# Patient Record
Sex: Female | Born: 1980
Health system: Southern US, Community
[De-identification: ages and names within clinical notes are randomized; demographics above are authoritative.]

## PROBLEM LIST (undated history)

## (undated) DIAGNOSIS — G43909 Migraine, unspecified, not intractable, without status migrainosus: Secondary | ICD-10-CM

## (undated) DIAGNOSIS — Z8489 Family history of other specified conditions: Secondary | ICD-10-CM

## (undated) DIAGNOSIS — K219 Gastro-esophageal reflux disease without esophagitis: Secondary | ICD-10-CM

## (undated) DIAGNOSIS — M419 Scoliosis, unspecified: Secondary | ICD-10-CM

## (undated) DIAGNOSIS — T148XXA Other injury of unspecified body region, initial encounter: Secondary | ICD-10-CM

## (undated) DIAGNOSIS — T8859XA Other complications of anesthesia, initial encounter: Secondary | ICD-10-CM

## (undated) DIAGNOSIS — J45909 Unspecified asthma, uncomplicated: Secondary | ICD-10-CM

## (undated) DIAGNOSIS — M1990 Unspecified osteoarthritis, unspecified site: Secondary | ICD-10-CM

## (undated) DIAGNOSIS — F419 Anxiety disorder, unspecified: Secondary | ICD-10-CM

## (undated) DIAGNOSIS — D649 Anemia, unspecified: Secondary | ICD-10-CM

## (undated) DIAGNOSIS — N809 Endometriosis, unspecified: Secondary | ICD-10-CM

## (undated) DIAGNOSIS — F32A Depression, unspecified: Secondary | ICD-10-CM

## (undated) DIAGNOSIS — F988 Other specified behavioral and emotional disorders with onset usually occurring in childhood and adolescence: Secondary | ICD-10-CM

## (undated) DIAGNOSIS — T7840XA Allergy, unspecified, initial encounter: Secondary | ICD-10-CM

## (undated) DIAGNOSIS — F329 Major depressive disorder, single episode, unspecified: Secondary | ICD-10-CM

## (undated) DIAGNOSIS — J189 Pneumonia, unspecified organism: Secondary | ICD-10-CM

## (undated) DIAGNOSIS — T4145XA Adverse effect of unspecified anesthetic, initial encounter: Secondary | ICD-10-CM

## (undated) HISTORY — DX: Allergy, unspecified, initial encounter: T78.40XA

## (undated) HISTORY — DX: Migraine, unspecified, not intractable, without status migrainosus: G43.909

## (undated) HISTORY — PX: COSMETIC SURGERY: SHX468

## (undated) HISTORY — DX: Anxiety disorder, unspecified: F41.9

## (undated) HISTORY — PX: ABDOMINAL HYSTERECTOMY: SHX81

## (undated) HISTORY — DX: Endometriosis, unspecified: N80.9

## (undated) HISTORY — DX: Other specified behavioral and emotional disorders with onset usually occurring in childhood and adolescence: F98.8

## (undated) HISTORY — DX: Scoliosis, unspecified: M41.9

---
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
## 1998-02-04 ENCOUNTER — Inpatient Hospital Stay (HOSPITAL_COMMUNITY): Admission: AD | Admit: 1998-02-04 | Discharge: 1998-02-04 | Payer: Self-pay | Admitting: Obstetrics and Gynecology

## 1998-03-12 ENCOUNTER — Inpatient Hospital Stay (HOSPITAL_COMMUNITY): Admission: AD | Admit: 1998-03-12 | Discharge: 1998-03-12 | Payer: Self-pay | Admitting: Obstetrics and Gynecology

## 1998-03-16 ENCOUNTER — Inpatient Hospital Stay (HOSPITAL_COMMUNITY): Admission: AD | Admit: 1998-03-16 | Discharge: 1998-03-16 | Payer: Self-pay | Admitting: Obstetrics and Gynecology

## 1998-04-08 ENCOUNTER — Inpatient Hospital Stay (HOSPITAL_COMMUNITY): Admission: AD | Admit: 1998-04-08 | Discharge: 1998-04-08 | Payer: Self-pay | Admitting: Obstetrics and Gynecology

## 1998-05-05 ENCOUNTER — Inpatient Hospital Stay (HOSPITAL_COMMUNITY): Admission: AD | Admit: 1998-05-05 | Discharge: 1998-05-08 | Payer: Self-pay | Admitting: *Deleted

## 2001-01-28 ENCOUNTER — Other Ambulatory Visit: Admission: RE | Admit: 2001-01-28 | Discharge: 2001-01-28 | Payer: Self-pay | Admitting: *Deleted

## 2001-08-07 ENCOUNTER — Inpatient Hospital Stay (HOSPITAL_COMMUNITY): Admission: AD | Admit: 2001-08-07 | Discharge: 2001-08-10 | Payer: Self-pay | Admitting: *Deleted

## 2003-01-04 ENCOUNTER — Other Ambulatory Visit: Admission: RE | Admit: 2003-01-04 | Discharge: 2003-01-04 | Payer: Self-pay | Admitting: *Deleted

## 2006-06-27 ENCOUNTER — Ambulatory Visit: Payer: Self-pay | Admitting: Family Medicine

## 2007-03-22 ENCOUNTER — Ambulatory Visit: Payer: Self-pay | Admitting: Family Medicine

## 2007-05-21 ENCOUNTER — Emergency Department (HOSPITAL_COMMUNITY): Admission: EM | Admit: 2007-05-21 | Discharge: 2007-05-22 | Payer: Self-pay | Admitting: Emergency Medicine

## 2007-07-11 ENCOUNTER — Ambulatory Visit (HOSPITAL_COMMUNITY): Admission: RE | Admit: 2007-07-11 | Discharge: 2007-07-11 | Payer: Self-pay | Admitting: Obstetrics and Gynecology

## 2007-07-12 ENCOUNTER — Inpatient Hospital Stay (HOSPITAL_COMMUNITY): Admission: AD | Admit: 2007-07-12 | Discharge: 2007-07-12 | Payer: Self-pay | Admitting: Obstetrics and Gynecology

## 2007-09-19 ENCOUNTER — Encounter (HOSPITAL_COMMUNITY): Payer: Self-pay | Admitting: Obstetrics and Gynecology

## 2007-09-19 ENCOUNTER — Inpatient Hospital Stay (HOSPITAL_COMMUNITY): Admission: RE | Admit: 2007-09-19 | Discharge: 2007-09-21 | Payer: Self-pay | Admitting: Obstetrics and Gynecology

## 2009-08-31 ENCOUNTER — Ambulatory Visit: Payer: Self-pay | Admitting: Family Medicine

## 2009-08-31 DIAGNOSIS — M545 Low back pain, unspecified: Secondary | ICD-10-CM | POA: Insufficient documentation

## 2009-09-27 DIAGNOSIS — Z8639 Personal history of other endocrine, nutritional and metabolic disease: Secondary | ICD-10-CM

## 2009-09-27 DIAGNOSIS — Z862 Personal history of diseases of the blood and blood-forming organs and certain disorders involving the immune mechanism: Secondary | ICD-10-CM | POA: Insufficient documentation

## 2009-09-27 DIAGNOSIS — G43009 Migraine without aura, not intractable, without status migrainosus: Secondary | ICD-10-CM | POA: Insufficient documentation

## 2010-05-09 ENCOUNTER — Encounter (INDEPENDENT_AMBULATORY_CARE_PROVIDER_SITE_OTHER): Payer: Self-pay | Admitting: *Deleted

## 2010-05-19 ENCOUNTER — Ambulatory Visit: Payer: Self-pay | Admitting: Family Medicine

## 2010-07-08 ENCOUNTER — Ambulatory Visit: Payer: Self-pay | Admitting: Family Medicine

## 2010-07-08 DIAGNOSIS — L03211 Cellulitis of face: Secondary | ICD-10-CM

## 2010-07-08 DIAGNOSIS — L0201 Cutaneous abscess of face: Secondary | ICD-10-CM | POA: Insufficient documentation

## 2010-10-01 HISTORY — PX: BREAST SURGERY: SHX581

## 2010-10-05 ENCOUNTER — Ambulatory Visit
Admission: RE | Admit: 2010-10-05 | Discharge: 2010-10-05 | Payer: Self-pay | Source: Home / Self Care | Attending: Family Medicine | Admitting: Family Medicine

## 2010-10-05 DIAGNOSIS — F4320 Adjustment disorder, unspecified: Secondary | ICD-10-CM | POA: Insufficient documentation

## 2010-10-31 NOTE — Letter (Signed)
Summary: Natasha Welch letter  [place] at [facility]  [address] [place], [place] [address]   Phone: [phone]  Fax: [phone]       05/09/2010 MRN: [id number]  [facility] [place] [address] [place], [place]  [address]  Dear Ms. Apodaca,  [facility] [place], and [place] announce the retirement of Arta Silence, M.D., from full-time practice at the [facility] office effective March 30, 2010 and his plans of returning part-time.  It is important to Dr. Hetty Ely and to our practice that you understand that [facility] has seven physicians in our office for your health care needs.  We will continue to offer the same exceptional care that you have today.    Dr. Hetty Ely has spoken to many of you about his plans for retirement and returning part-time in the fall.   We will continue to work with you through the transition to schedule appointments for you in the office and meet the high standards that [facility] is committed to.   Again, it is with great pleasure that we share the news that Dr. Hetty Ely will return to [facility] in October of 2011 with a reduced schedule.    If you have any questions, or would like to request an appointment with one of our physicians, please call us at [phone] and press the option for Scheduling an appointment.  We take pleasure in providing you with excellent patient care and look forward to seeing you at your next office visit.  Our [facility] Physicians are:  Tillman Abide, M.D. Laurita Quint, M.D. Roxy Manns, M.D. Kerby Nora, M.D. Hannah Beat, M.D. Ruthe Mannan, M.D. We proudly welcomed Raechel Ache, M.D. and Eustaquio Boyden, M.D. to the practice in July/August 2011.  Sincerely,  [facility]
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
## 2010-10-31 NOTE — Assessment & Plan Note (Signed)
Summary: back pain/alc   Vital Signs:  Patient profile:   30 year old female Height:      66 inches Weight:      194.75 pounds BMI:     31.55 Temp:     98.9 degrees F oral Pulse rate:   88 / minute Pulse rhythm:   regular BP sitting:   98 / 58  (left arm) Cuff size:   large  Vitals Entered By: Delilah Shan CMA Duncan Dull) (May 19, 2010 2:38 PM) CC: Back pain   History of Present Illness: Pain on R side today after lifting at work yesterday.  Works at a daycare.  No pain yesterady.  No FCNAV.  No dysuria.  R lower back and down R leg. Better with standing or "getting in a good position."  Pain w/twisthing to the right.  Able to walk but painful.  "It's like something grabs in my lower back back and sends a funny feeling downt the back of my leg."    Allergies: 1)  ! * Tetanus 2)  ! * Anesthesia  Review of Systems       See HPI.  Otherwise negative.    Physical Exam  General:  GEN: nad, alert and oriented, uncomfortable with certain movement.  HEENT: mucous membranes moist NECK: supple w/o LA CV: rrr.  no murmur PULM: ctab, no inc wob ABD: soft, +bs EXT: no edema SKIN: no acute rash  Back w/o midline pain.  pain with facet loading and forward flexion <45deg. R lumbar paraspinal area tender to palpation. SLR neg for radicular pain but pain increases in R lower back with testing.  L SLR neg.  Distally NV intact.    Impression & Recommendations:  Problem # 1:  LOW BACK PAIN, ACUTE (ICD-724.2) R sciatica/lumbar strain.  Demonstrated stretching for patient.  If not better, will need PT. She was also asking about weight loss meds.  I d/w patient re: these. To my knowledge, there is no long-term evidence that demonstrates safe, effective weight loss with any supplement.  I would not rx.  She needs to work on diet and exercise with goal 1-2 lbs per month of loss.  She can go back to work when not needing pain meds.  follow up as needed.  GI and sedation caution given BM:WUXL.  The  following medications were removed from the medication list:    Vicodin 5-500 Mg Tabs (Hydrocodone-acetaminophen) .Marland Kitchen... Take 1 every 6 hrs as needed pain    Soma 250 Mg Tabs (Carisoprodol) .Marland Kitchen... Take up to 4 times a day for muscle spasm Her updated medication list for this problem includes:    Ibuprofen 800 Mg Tabs (Ibuprofen) .Marland Kitchen... 1 by mouth three times a day as needed for pain with food    Vicodin 5-500 Mg Tabs (Hydrocodone-acetaminophen) .Marland Kitchen... 1-2 by mouth three times a day as needed for pain with sedation caution  Complete Medication List: 1)  Ibuprofen 800 Mg Tabs (Ibuprofen) .Marland Kitchen.. 1 by mouth three times a day as needed for pain with food 2)  Vicodin 5-500 Mg Tabs (Hydrocodone-acetaminophen) .Marland Kitchen.. 1-2 by mouth three times a day as needed for pain with sedation caution  Patient Instructions: 1)  Return to work when not needing pain meds.  Call back if not improving with stretching so we can set up physical therapy.  2)  Please schedule a follow-up appointment as needed .  Prescriptions: VICODIN 5-500 MG TABS (HYDROCODONE-ACETAMINOPHEN) 1-2 by mouth three times a day as needed for  pain with sedation caution  #30 x 0   Entered and Authorized by:   Crawford Givens MD   Signed by:   Crawford Givens MD on 05/19/2010   Method used:   Print then Give to Patient   RxID:   [id number] IBUPROFEN 800 MG TABS (IBUPROFEN) 1 by mouth three times a day as needed for pain with food  #30 x 0   Entered and Authorized by:   Crawford Givens MD   Signed by:   Crawford Givens MD on 05/19/2010   Method used:   Print then Give to Patient   RxID:   [id number]   Current Allergies (reviewed today): ! * TETANUS ! * ANESTHESIA
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
## 2010-11-24 ENCOUNTER — Ambulatory Visit (INDEPENDENT_AMBULATORY_CARE_PROVIDER_SITE_OTHER): Payer: BC Managed Care – PPO | Admitting: Family Medicine

## 2010-11-24 ENCOUNTER — Encounter: Payer: Self-pay | Admitting: Family Medicine

## 2010-11-24 DIAGNOSIS — M545 Low back pain, unspecified: Secondary | ICD-10-CM

## 2010-11-28 NOTE — Assessment & Plan Note (Signed)
Summary: BACK PAIN/CLE   [employer]   Vital Signs:  Patient profile:   30 year old female Height:      66 inches Weight:      180.75 pounds BMI:     29.28 Temp:     98.4 degrees F oral Pulse rate:   80 / minute Pulse rhythm:   regular BP sitting:   102 / 72  (left arm) Cuff size:   regular  Vitals Entered By: Delilah Shan CMA Cloys Vera Dull) (November 24, 2010 11:19 AM) CC: Back pain   History of Present Illness: Home situation is now improved.  Moved this past weekend and the back pain flared after that. She ran out of vicodin on Tuesday.  Pain in L lower back and down L leg.  Better if laying on stomach or sitting forward- worse leaning back.  She had some periods with lack of pain but it flared up with the move.  Living with kids.  Was lifting with the move. No pop/snap/sudden increase in pain.   Working to lose weight, working on diet.    Allergies: 1)  ! * Tetanus 2)  ! * Anesthesia  Past History:  Past Medical History: Last updated: 10/05/2010 adjustment/anxiety related to marriage/divorce 2011  Social History: Reviewed history from 10/05/2010 and no changes required. Marital Status: divorce pending as of 2012 Children: 2- daughter and son occ alcohol  Review of Systems       See HPI.  Otherwise negative.    Physical Exam  General:  no apparent distress but uncomfortable, sitting forward in the chair normocephalic atraumatic mucous membranes moist neck with normal range of motion regular rate and rhythm clear to auscultation bilaterally back w/o midline pain but bilateral lower paraspinal muscle tenderness.  L SLR pos, distally nv intact.  pain with back extension,  and with flexion at the waist >45deg   Impression & Recommendations:  Problem # 1:  LOW BACK PAIN, ACUTE (ICD-724.2) She has no weakness.  With the pos SLR and radicular symptoms, I would start the prednisone, hold other nsaids, and GI caution given.  Use the vicodin as needed and continue stretching.   No indication for imaging.  follow up/call back as needed. she agrees.  Her updated medication list for this problem includes:    Vicodin 5-500 Mg Tabs (Hydrocodone-acetaminophen) .Marland Kitchen... 1-2 by mouth three times a day as needed for pain with sedation caution  Complete Medication List: 1)  Vicodin 5-500 Mg Tabs (Hydrocodone-acetaminophen) .Marland Kitchen.. 1-2 by mouth three times a day as needed for pain with sedation caution 2)  Bactroban 2 % Oint (Mupirocin) .... Apply to affected area two times a day 3)  Prozac 20 Mg Caps (Fluoxetine hcl) .Marland Kitchen.. 1 by mouth once daily 4)  Xanax 0.5 Mg Tabs (Alprazolam) .... 1/2 to 1 tab by mouth three times a day as needed for anxiety, sedation caution 5)  Prednisone 20 Mg Tabs (Prednisone) .... 2 by mouth once daily for 3 days, 1 by mouth once daily for 3 days, 1/2 by mouth once daily for 4 days.  take with food.  Patient Instructions: 1)  Take the prednisone with food.  Use the vicodin as needed.  It can make you drowsy.  Keep exercising and stretching your back.  Let me know if it isn't getting better.  Prescriptions: VICODIN 5-500 MG TABS (HYDROCODONE-ACETAMINOPHEN) 1-2 by mouth three times a day as needed for pain with sedation caution  #30 x 0   Entered and Authorized by:  Crawford Givens MD   Signed by:   Crawford Givens MD on 11/24/2010   Method used:   Print then Give to Patient   RxID:   [id number] PREDNISONE 20 MG TABS (PREDNISONE) 2 by mouth once daily for 3 days, 1 by mouth once daily for 3 days, 1/2 by mouth once daily for 4 days.  take with food.  #11 x 0   Entered and Authorized by:   Crawford Givens MD   Signed by:   Crawford Givens MD on 11/24/2010   Method used:   Print then Give to Patient   RxID:   [id number]    Orders Added: 1)  Est. Patient Level III [64403]    Current Allergies (reviewed today): ! * TETANUS ! * ANESTHESIA

## 2010-12-01 ENCOUNTER — Telehealth: Payer: Self-pay | Admitting: Family Medicine

## 2010-12-04 ENCOUNTER — Telehealth: Payer: Self-pay | Admitting: Family Medicine

## 2010-12-06 ENCOUNTER — Telehealth: Payer: Self-pay | Admitting: Family Medicine

## 2010-12-07 ENCOUNTER — Encounter: Payer: Self-pay | Admitting: Family Medicine

## 2010-12-07 ENCOUNTER — Ambulatory Visit (INDEPENDENT_AMBULATORY_CARE_PROVIDER_SITE_OTHER): Payer: BC Managed Care – PPO | Admitting: Family Medicine

## 2010-12-07 DIAGNOSIS — M545 Low back pain, unspecified: Secondary | ICD-10-CM

## 2010-12-07 NOTE — Progress Notes (Signed)
Summary: back pain is not better  Phone Note Call from Patient Call back at Home Phone [phone]   Caller: Patient Call For: Dr. Para March Summary of Call: Pt was seen last friday for back pain.  She states this is not getting any better.  She will finish her  prednisone this weekend and she is almost out of vicodin.  She has been doing the stretches and exercises, using heating pad.  She is asking what else to do.  [facility]. Initial call taken by: Lowella Petties CMA, AAMA,  December 01, 2010 2:25 PM  Follow-up for Phone Call        I would get her set up with physical therapy.  I'll put in the order.  Have her finish the prednisone and please call in more vicodin for the meantime.  thanks.  Follow-up by: Crawford Givens MD,  December 01, 2010 2:45 PM  Additional Follow-up for Phone Call Additional follow up Details #1::        Patient Advised. Medication phoned to pharmacy.  Additional Follow-up by: Delilah Shan CMA Dawnya Grams Dull),  December 01, 2010 4:34 PM    Prescriptions: VICODIN 5-500 MG TABS (HYDROCODONE-ACETAMINOPHEN) 1-2 by mouth three times a day as needed for pain with sedation caution  #30 x 0   Entered and Authorized by:   Crawford Givens MD   Signed by:   Crawford Givens MD on 12/01/2010   Method used:   Telephoned to ...       [facility]* (retail)       [address]       [place], [place]  [address]       Ph: [phone]       Fax: [phone]   RxID:   [id number]

## 2010-12-12 NOTE — Progress Notes (Signed)
Summary: xanax   Phone Note Refill Request Message from:  Fax from Pharmacy on December 04, 2010 10:37 AM  Refills Requested: Medication #1:  XANAX 0.5 MG TABS 1/2 to 1 tab by mouth three times a day as needed for anxiety   Last Refilled: 10/05/2010 Refill request from [facility]. [phone]  Initial call taken by: Melody Comas,  December 04, 2010 10:38 AM  Follow-up for Phone Call        please call in.  thanks.  Follow-up by: Crawford Givens MD,  December 04, 2010 2:03 PM  Additional Follow-up for Phone Call Additional follow up Details #1::        Medication phoned to pharmacy.  Additional Follow-up by: Delilah Shan CMA (AAMA),  December 04, 2010 2:08 PM    Prescriptions: XANAX 0.5 MG TABS (ALPRAZOLAM) 1/2 to 1 tab by mouth three times a day as needed for anxiety, sedation caution  #40 x 1   Entered and Authorized by:   Crawford Givens MD   Signed by:   Crawford Givens MD on 12/04/2010   Method used:   Telephoned to ...       [facility]* (retail)       [address]       [place], [place]  [address]       Ph: [phone]       Fax: [phone]   RxID:   [id number]

## 2010-12-12 NOTE — Assessment & Plan Note (Signed)
Summary: Back pain /lsf   Vital Signs:  Patient profile:   30 year old female Height:      66 inches Weight:      186.75 pounds BMI:     30.25 Temp:     97.7 degrees F oral Pulse rate:   84 / minute Pulse rhythm:   regular BP sitting:   102 / 70  (left arm) Cuff size:   regular  Vitals Entered By: Delilah Shan CMA Dorris Vangorder Dull) (December 07, 2010 10:57 AM) CC: Back pain   History of Present Illness: No help with prednisone.  Pain on L side continues.  Pain radiates down into L leg.  Minimal relief with the vicodin.  Trouble sleeping from the pain, this is getting worse.  This is 3 weeks into the pain.  Not in physical therapy yet, this is set to start on Wednesday.  No weakness.  Can weight bear, but she has to compensate for the pain. Slow to straighten up.  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  This helps.   Step mother is dying on hospice in [place].  Pt is trying to care for her at night and she can't stretch for her back as she does when she is at home.    Allergies: 1)  ! * Tetanus 2)  ! * Anesthesia  Review of Systems       See HPI.  Otherwise negative.    Physical Exam  General:  no apparent distress but uncomfortable, sitting forward in the chair normocephalic atraumatic mucous membranes moist neck with normal range of motion regular rate and rhythm clear to auscultation bilaterally back w/o midline pain but bilateral lower paraspinal muscle tenderness, L>R with muscle spasms noted L SLR pos, distally nv intact.    Impression & Recommendations:  Problem # 1:  LOW BACK PAIN, ACUTE (ICD-724.2) Pt to follow up with physical therapy.  cont vicodin during the day along with heat, stretching as much as possible.  Gradually increase gabapentin to see if this helps.  I didn't start flexeril due to concern for sedation- we'll see if she tolerates the current meds and can get the muscles to relax with heat.  She agrees with plan, will call back next week as needed.  No need  to image yet, but if symptoms persist, she may need imaging and referral.  No weakness or red flag symptoms on exam/hx today.  Her updated medication list for this problem includes:    Vicodin 5-500 Mg Tabs (Hydrocodone-acetaminophen) .Marland Kitchen... 1-2 by mouth three times a day as needed for pain with sedation caution  Complete Medication List: 1)  Vicodin 5-500 Mg Tabs (Hydrocodone-acetaminophen) .Marland Kitchen.. 1-2 by mouth three times a day as needed for pain with sedation caution 2)  Bactroban 2 % Oint (Mupirocin) .... Apply to affected area two times a day 3)  Prozac 20 Mg Caps (Fluoxetine hcl) .Marland Kitchen.. 1 by mouth once daily 4)  Xanax 0.5 Mg Tabs (Alprazolam) .... 1/2 to 1 tab by mouth three times a day as needed for anxiety, sedation caution 5)  Gabapentin 300 Mg Caps (Gabapentin) .Marland Kitchen.. 1 by mouth today, then increase to two times a day, then three times a day as needed.  sedation caution  Patient Instructions: 1)  Keep stretching and using the heating pad for your back.  Gradually increase the neurontin/gabapentin.  If you aren't improving next week, call me.  Try to go to physical therapy.  Take care.  Prescriptions: VICODIN 5-500  MG TABS (HYDROCODONE-ACETAMINOPHEN) 1-2 by mouth three times a day as needed for pain with sedation caution  #30 x 0   Entered and Authorized by:   Crawford Givens MD   Signed by:   Crawford Givens MD on 12/07/2010   Method used:   Print then Give to Patient   RxID:   [id number] GABAPENTIN 300 MG CAPS (GABAPENTIN) 1 by mouth today, then increase to two times a day, then three times a day as needed.  sedation caution  #40 x 1   Entered and Authorized by:   Crawford Givens MD   Signed by:   Crawford Givens MD on 12/07/2010   Method used:   Print then Give to Patient   RxID:   [id number]    Orders Added: 1)  Est. Patient Level III [84696]    Current Allergies (reviewed today): ! * TETANUS ! * ANESTHESIA

## 2010-12-12 NOTE — Progress Notes (Signed)
Summary: vicodin isnt helping back pain  Phone Note Call from Patient Message from:  Patient  Refills Requested: Medication #1:  VICODIN 5-500 MG TABS 1-2 by mouth three times a day as needed for pain with sedation caution Pt   Caller: Patient Call For: Crawford Givens MD Summary of Call: Pt has been taking vicodin for back pain- taking one every 4 hours, but this is not helping at all.  She is asking for something stronger to be called to [place]. Please let pt know.  I told her it would be tomorrow, she is ok with that. Initial call taken by: Lowella Petties CMA, AAMA,  December 06, 2010 2:58 PM  Follow-up for Phone Call        If she is in that much pain, then she needs an OV. please schedule . Crawford Givens MD  December 06, 2010 3:53 PM   Appt. scheduled today.  Could not schedule 30 minutes, sorry. Lugene Fuquay CMA (AAMA)  December 07, 2010 10:13 AM

## 2010-12-13 ENCOUNTER — Telehealth: Payer: Self-pay | Admitting: Family Medicine

## 2010-12-19 NOTE — Progress Notes (Signed)
Summary: refill request for vicodin  Phone Note Refill Request Message from:  Fax from Pharmacy  Refills Requested: Medication #1:  VICODIN 5-500 MG TABS 1-2 by mouth three times a day as needed for pain with sedation caution   Last Refilled: 12/07/2010 Faxed request from [facility], [phone].  Initial call taken by: Lowella Petties CMA, AAMA,  December 13, 2010 12:54 PM  Follow-up for Phone Call        please call in.  If she isn't improving with this round of medicine along with PT, then have her notify the clinic.  thanks.  Crawford Givens MD  December 13, 2010 1:40 PM   Patient Advised.  Medication phoned to pharmacy. Delilah Shan CMA (AAMA)  December 13, 2010 2:56 PM     Prescriptions: VICODIN 5-500 MG TABS (HYDROCODONE-ACETAMINOPHEN) 1-2 by mouth three times a day as needed for pain with sedation caution  #30 x 0   Entered and Authorized by:   Crawford Givens MD   Signed by:   Crawford Givens MD on 12/13/2010   Method used:   Telephoned to ...       [facility]* (retail)       [address]       [place], [place]  [address]       Ph: [phone]       Fax: [phone]   RxID:   [id number]

## 2010-12-21 ENCOUNTER — Telehealth: Payer: Self-pay | Admitting: *Deleted

## 2010-12-21 ENCOUNTER — Other Ambulatory Visit: Payer: Self-pay | Admitting: *Deleted

## 2010-12-21 MED ORDER — HYDROCODONE-ACETAMINOPHEN 5-500 MG PO CAPS: ORAL_CAPSULE | ORAL | Status: DC

## 2010-12-21 NOTE — Telephone Encounter (Signed)
Noted.  Will await input from patient.

## 2010-12-21 NOTE — Telephone Encounter (Signed)
Spoke with patient.  She is not much better but had to cancel her PT last week because of a death in the family.  She is scheduled for next Thursday for PT but may have to call again for a refill before then.  She will ask the PT's if she will likely need a referral and let us know.

## 2010-12-21 NOTE — Telephone Encounter (Signed)
Left message on patient's ans. Machine to return call.

## 2010-12-21 NOTE — Telephone Encounter (Signed)
Please refill this med.  Call pt.  If she is getting some better, then it is okay to proceed with this rx.  If she isn't better at all, send in the rx anyway and let me know so I can get the referral done.  Thanks.

## 2010-12-21 NOTE — Telephone Encounter (Signed)
Patient phoned.  She states she had to cancel her PT last week because of a death in the family.  She has an appt this week on Thursday.  She will as the PT's opinion as to whether she needs the referral and says she will likely need another refill by then.

## 2010-12-22 ENCOUNTER — Other Ambulatory Visit: Payer: Self-pay | Admitting: *Deleted

## 2010-12-22 NOTE — Telephone Encounter (Signed)
Medication phoned to pharmacy, [facility].

## 2010-12-22 NOTE — Telephone Encounter (Signed)
Medication phoned to pharmacy - [facility].

## 2011-01-03 ENCOUNTER — Other Ambulatory Visit: Payer: Self-pay | Admitting: *Deleted

## 2011-01-03 MED ORDER — HYDROCODONE-ACETAMINOPHEN 5-500 MG PO CAPS: ORAL_CAPSULE | ORAL | Status: DC

## 2011-01-03 NOTE — Telephone Encounter (Signed)
Medication phoned to pharmacy.  

## 2011-01-03 NOTE — Telephone Encounter (Signed)
Please call in.  Thanks.   

## 2011-01-04 ENCOUNTER — Telehealth: Payer: Self-pay

## 2011-01-12 NOTE — Telephone Encounter (Signed)
OPEN IN ERROR 

## 2011-02-13 NOTE — Op Note (Signed)
NAMEGUILIANA, Natasha Welch             ACCOUNT NO.:  [id number]   MEDICAL RECORD NO.:  [id number]          PATIENT TYPE:  AMB   LOCATION:  SDC                           [facility]ILITY:  [facility]   PHYSICIAN:  Zelphia Cairo, MD    DATE OF BIRTH:  July 18, 1981   DATE OF PROCEDURE:  07/11/2007  DATE OF DISCHARGE:                               OPERATIVE REPORT   PREOPERATIVE DIAGNOSIS:  Pelvic pain.   POSTOPERATIVE DIAGNOSES:  1. Pelvic pain.  2. Endometriosis.   PROCEDURE:  Diagnostic laparoscopy with fulguration of endometriosis.   SURGEON:  Zelphia Cairo, M.D.   ASSISTANT:  None.   ANESTHESIA:  General.   FINDINGS:  Endometriosis.  Implants were noted on bilateral ovaries  within the posterior cul-de-sac and bilateral uterosacral ligaments.  Endometrial implants along c-section scar, anterior uterus.  Appendix  and right upper quadrant appeared normal.   SPECIMENS:  None.   ESTIMATED BLOOD LOSS:  Minimal.   COMPLICATIONS:  None.   CONDITION:  Stable and extubated to recovery room.   PROCEDURE:  The patient was taken to the operating room where general  anesthesia was obtained.  She was placed in the dorsal lithotomy  position using Allen stirrups.  She was prepped and draped in sterile  fashion, and a catheter was used to drain her bladder for approximately  30 mL of clear urine.  Bivalve speculum was then placed in the vagina  and a single-tooth tenaculum placed on the uterus.  A Hulka clamp was  placed for uterine manipulation.  Tenaculum and speculum were then  removed, and our attention was turned to the abdomen.   An infraumbilical skin incision was made with the scalpel, and a blunt  trocar was inserted under direct visualization.  Once intraperitoneal  placement was confirmed, CO2 was turned on, and abdomen and pelvis were  insufflated.  A small suprapubic incision was then made with a scalpel,  and a 5-mm trocar was inserted under direct visualization.  The patient  was  placed in Trendelenburg position.  A blunt probe was used to sweep  the bowel out of the cul-de-sac, and a survey of the pelvis and abdomen  were performed.  The above findings were noted.  The endometrial  implants were cauterized using the gyrus needle point and coagulable  scissors.  Bilateral uterosacral ligaments and posterior cul-de-sac  implants were cauterized.  A small endometrioma of the left ovary was  resected and cauterized using cautery scissors.  All instruments and  trocars were then removed from the abdomen.  The fascia of the  infraumbilical  skin incision was reapproximated with Vicryl, and the skin incisions  were both closed with 4-0 Vicryl.  Local anesthesia was provided at skin  incisions.  Hulka clamp was removed.  The patient was extubated and  taken to the recovery room in stable condition.      Zelphia Cairo, MD  Electronically Signed     GA/MEDQ  D:  07/11/2007  T:  07/11/2007  Job:  [id number]

## 2011-02-13 NOTE — H&P (Signed)
NAME:  Natasha Welch, Natasha Welch             ACCOUNT NO.:  [id number]   MEDICAL RECORD NO.:  [id number]          PATIENT TYPE:  AMB   LOCATION:  SDC                           [facility]   PHYSICIAN:  Zelphia Cairo, MD    DATE OF BIRTH:  25-Mar-1981   DATE OF ADMISSION:  DATE OF DISCHARGE:                              HISTORY & PHYSICAL   HISTORY OF PRESENT ILLNESS:  This is a 30 year old white female with  severe pelvic pain.  She was initially evaluated in August 2008 when she  complained of severe right lower quadrant pain that began 5 days prior  to evaluation.  Since then, the pain has only been resolved with  narcotic medication.  She denies nausea, vomiting, fevers or chills.  No  bowel or bladder complaints.   PAST MEDICAL HISTORY:  Otherwise negative.   SURGICAL HISTORY:  Tonsillectomy.   ALLERGIES:  None.   MEDICATIONS:  Percocet and Vicodin.   OBSTETRICAL HISTORY:  Significant for 2 full-term deliveries, both by  cesarean section.   GYNECOLOGICAL HISTORY:  Significant for irregular, but monthly periods.  Her last menstrual period was September 2008.  Her husband has a  vasectomy.   FAMILY HISTORY:  Noncontributory.   PHYSICAL EXAMINATION:  Height 5 feet 6 inches, weight 168.  Blood  pressure 110/70.  Hemoglobin 13.7.  Urinalysis is negative.  HEAD AND NECK:  Normal.  No thyromegaly or nodularity.  HEART:  Regular rate and rhythm.  LUNGS:  Clear bilaterally.  ABDOMEN:  Soft and nontender in the bilateral upper quadrant.  She does  have significant right lower quadrant tenderness, mild left lower  quadrant tenderness.  No rebound or guarding are noted on exam.  PELVIC:  Exam shows normal external female genitalia and urethral  meatus.  Vagina and cervix are normal without lesions.  Uterus is mobile  and mildly tender to manipulation.  Ovaries are nonpalpable due to  voluntary guarding.   IMAGING STUDY:  CT scan was done showing bilateral ovarian cysts, no  evidence  of free fluid and a normal-appearing appendix.   Pelvic ultrasound showed a 2.2-cm left ovarian cyst, but was otherwise  negative.   In our office, a followup pelvic ultrasound showed a left ovarian cyst  measuring 1.4 x 9 mm and a right ovarian cyst measuring 1.5 x 1.4 cm in  the lateral aspect of the ovary.  Multiple calcifications were noted on  the right ovarian mass consistent with possible endometrioma.   ASSESSMENT AND PLAN:  A 25-year white female with bilateral ovarian  cysts, possible endometriosis, planned for diagnostic laparoscopy with  ovarian cystectomy and possible fulguration of endometriosis,      Zelphia Cairo, MD  Electronically Signed     GA/MEDQ  D:  07/10/2007  T:  07/11/2007  Job:  [id number]

## 2011-02-13 NOTE — Assessment & Plan Note (Signed)
[facility]                                 ON-CALL NOTE   NAME:[name], Natasha Welch                      MRN:          [id number]  DATE:03/22/2007                            DOB:          04-Mar-1981    Patient of Dr. Hetty Ely.  Called complaining that her eye was very  swollen and pinkeye was going around the daycare.  The patient is coming  in this morning to be evaluated in the clinic.     Lelon Perla, DO  Electronically Signed    Shawnie Dapper  DD: 03/22/2007  DT: 03/22/2007  Job #: [id number]   cc:   Arta Silence, MD

## 2011-02-13 NOTE — Op Note (Signed)
Natasha Welch, Natasha Welch             ACCOUNT NO.:  [id number]   MEDICAL RECORD NO.:  [id number]          PATIENT TYPE:  INP   LOCATION:  9319                          [facility]ILITY[facility]   PHYSICIAN:  Zelphia Cairo, MD    DATE OF BIRTH:  29-Jul-1981   DATE OF PROCEDURE:  09/19/2007  DATE OF DISCHARGE:                               OPERATIVE REPORT   PREOPERATIVE DIAGNOSES:  1. Pelvic pain.  2. Endometriosis.   POSTOPERATIVE DIAGNOSES:  1. Pelvic pain.  2. Endometriosis.  3. Left endometrioma.   PROCEDURE:  Total abdominal hysterectomy, a left ovarian cystectomy with  fulguration of endometriosis.   SURGEON:  Zelphia Cairo, M.D.   ASSISTANT:  Stann Mainland. Vincente Poli, M.D.   ANESTHESIA:  General.   SPECIMEN:  Uterus and cervix to pathology.   ESTIMATED BLOOD LOSS:  150 mL.   URINE OUTPUT:  Clear.   COMPLICATIONS:  None.   CONDITION:  Stable to recovery room.   PROCEDURE:  Natasha Welch is a 30 year old, G2, P2 who has had ongoing chronic  pelvic pain due to endometriosis.  She underwent diagnostic laparoscopy  in October showing endometriosis.  Following the operation, the patient  continued to have severe pelvic pain requiring daily narcotic  medication.  Her pain was unable to be controlled medically despite  fulguration of endometriosis at the time of diagnostic laparoscopy.  For  this reason, the patient requested hysterectomy.  We discussed the risks  of surgery including bleeding, infection, need for reoperation, damage  to bowel or bladder, other surrounding organs, failure of hysterectomy  to control her pain and need for further operation due to endometriosis  and pelvic pain.  We also discussed the risk of regret given that she is  30 years old.  She is adamant she no longer desires future fertility.   The patient was taken to the operating room where general anesthesia was  obtained.  She was in the supine position.  She was prepped and draped  in sterile fashion, and a  Foley catheter was inserted sterilely.  A  Pfannenstiel skin incision was made with the scalpel and carried down to  the underlying fascia.  The fascia was incised in the midline, and this  was extended laterally using Mayo scissors.  Kocher clamps were used to  grasp the anterior portion of the fascia.  This was tented upwards, and  the underlying rectus muscles were dissected off using Mayo scissors.  The inferior portion of the fascia was dissected off the underlying  rectus muscles using Mayo scissors.  The peritoneum was then identified  and tented upwards with 2 hemostats.  Metzenbaum scissors were used to  enter the peritoneum sharply.  This was extended superiorly and  inferiorly with good visualization of the bladder.  The Lenox Ahr retractor was placed, and a survey of the pelvis was  performed.  She was noted to have some endometriosis implants on the  uterus at the level of the bladder flap.  She also was noted to have a  left endometrioma in two small implants of endometriosis on the right  ovary.  The right ovarian implants were cauterized using the Bovie.  The  bowel was packed away with moist laps.  The patient was placed in  Trendelenburg.   The bilateral cornua of the uterus were grasped with Kelly clamps.  The  round ligaments on both sides were clamped, transected and suture  ligated.  The anterior leaf of the broad ligament was incised along the  bladder reflection to the midline from both sides.  The bladder was  gently dissected off the lower uterine segment using sponge stick.  A  small window was then made through the broad ligament ,and the utero-  ovarian ligament was clamped, transected with Mayo scissors and doubly  suture ligated using 0 Vicryl.  Hemostasis was visualized bilaterally.  The uterine arteries were then skeletonized bilaterally and clamped with  Heaney clamps, transected and suture ligated with 0 Vicryl.  Once  hemostasis was assured  bilaterally, uterosacral ligaments were clamped  bilaterally, transected and suture ligated.  The cervix and uterus were  then amputated using curved scissors.  The vaginal cuff angles were  closed using figure-of-eight suture stitches of 0 Vicryl.  The remainder  of the vaginal cuff was closed using a series of figure-of-eight  stitches using #0 Vicryl.  The pelvis was then copiously irrigated with  normal saline.  The cuff was found to be hemostatic, and our attention  was turned to the left ovary which had a small endometrioma.   The endometrioma was incised and drained.  The inner portion of the cyst  was irrigated, and the cyst wall was removed.  The base of the cyst wall  was then cauterized using the Bovie.  Once hemostasis was assured, the  pelvis was again irrigated with normal saline.  All laparotomy sponges,  instruments and retractors were then removed from the abdomen.  The  peritoneum was closed with Vicryl.  The fascia was closed with a looped  0 PDS, and the skin was closed with staples.  The patient tolerated the  procedure well.  Sponge, lap, needle and instrument counts were correct  x2.      Zelphia Cairo, MD  Electronically Signed     GA/MEDQ  D:  09/19/2007  T:  09/20/2007  Job:  [id number]

## 2011-02-13 NOTE — H&P (Signed)
Natasha Welch, Natasha Welch             ACCOUNT NO.:  [id number]   MEDICAL RECORD NO.:  [id number]          PATIENT TYPE:  AMB   LOCATION:  SDC                           [facility]   PHYSICIAN:  Zelphia Cairo, MD    DATE OF BIRTH:  02-27-1981   DATE OF ADMISSION:  09/19/2007  DATE OF DISCHARGE:                              HISTORY & PHYSICAL   She is to be admitted September 19, 2007 for surgery.  A 30 year old G2,  P2 with chronic incapacitating pelvic pain.  She underwent diagnostic  laparoscopy with fulguration of endometriosis, July 11, 2007.  Since  surgery, she has not had good pain relief, despite continued oral  contraceptives pills and narcotic pain medications.  She has had a tubal  ligation.  She and her husband are sure they do not desire future  fertility, and she is requesting definitive management with  hysterectomy.   PAST MEDICAL HISTORY:  Negative.   PAST SURGICAL HISTORY:  Includes C-section x2, diagnostic laparoscopy.   ALLERGIES:  None.   MEDICATIONS:  Percocet, Vicodin.   GYN HISTORY:  Significant for endometriosis.  Her last Pap smear in  August, 2008 was negative.   FAMILY HISTORY:  Noncontributory.   PHYSICAL EXAMINATION:  Weight 170.  Blood pressure 100/70.  HEART:  Regular rate and rhythm.  LUNGS:  Clear bilaterally.  ABDOMEN:  Soft.  Nontender.  Nondistended.  No rebound or guarding  noted.  PELVIC:  Normal external female genitalia and urethral meatus.  Vagina  and cervix are normal without lesions.  The uterus is mobile and very  tender to manipulation.  No adnexal masses are palpated bilaterally.   ASSESSMENT/PLAN:  A 30 year old G2, P2 with endometriosis presenting for  hysterectomy.  Plan for total abdominal hysterectomy.      Zelphia Cairo, MD  Electronically Signed     GA/MEDQ  D:  09/18/2007  T:  09/18/2007  Job:  [id number]

## 2011-02-16 NOTE — Op Note (Signed)
[facility]  Patient:    Natasha Welch, Natasha Welch Visit Number: [id number] MRN: [id number]          Service Type: OBS Location: 910A 9133 01 Attending Physician:  Donne Hazel Dictated by:   Willey Blade, M.D. Proc. Date: 08/07/01 Admit Date:  08/07/2001                             Operative Report  PREOPERATIVE DIAGNOSES:       1. Intrauterine pregnancy at term.                               2. Repeat cesarean section.  POSTOPERATIVE DIAGNOSES:      1. Intrauterine pregnancy at term.                               2. Repeat cesarean section.  OPERATION:                    Repeat low transverse cesarean section.  SURGEON:                      Willey Blade, M.D.  ANESTHESIA:                   Spinal.  ESTIMATED BLOOD LOSS:         800 cc.  COMPLICATIONS:                None.  FINDINGS:                     At 1438, through a low transverse uterine incision, a viable female infant was delivered from the vertex presentation. The baby was a female weighing 8 pounds and 8 ounces.  Apgars were 8 and 9.  The pelvis was visualized at the time of surgery and noted to be normal.  DESCRIPTION OF PROCEDURE:     The patient was taken to the operating room where a spinal anesthetic was administered.  The patient was then placed on the operating table in the left lateral tilt position.  The abdomen was prepped and draped in the usual sterile fashion with Betadine and sterile drapes.  A catheter was inserted.  The abdomen was then entered through a Pfannenstiel incision and carried down sharply in the usual fashion.  The peritoneum was atraumatically entered.  The vesicouterine peritoneum overlying the lower uterine segment was incised and a bladder flap was bluntly and sharply created over the lower uterine segment.  A bladder blade was then placed behind the bladder to ensure its protection during this procedure.  The uterus was then entered through a low  transverse incision and carried out laterally using the operators fingers.  The membranes were entered with clear fluid noted.  The vertex was elevated into the incision and delivered promptly and easily in the usual fashion at 1438.  The oropharynx and nasopharynx were thoroughly bulb suctioned, and the cord doubly clamped and cut.  The baby handed promptly to the pediatricians.  The baby was a female infant weighing 8 pounds and 8 ounces.  Delivered at 1438.  Apgars were 8 and 9.  The baby did well.  The placenta was then manually extracted intact with three vessel cord without difficulty.  The anterior  of the uterus was wiped clean thoroughly with a wet sponge.  The uterine incision was then closed in a two layered fashion, the first layer with a running interlocking suture of #1 Vicryl.  A second imbricating suture was placed across the primary suture with a running stitch of #1 Vicryl as well.  The pelvis was then thoroughly irrigated with copious amounts of irrigant and noted to be hemostatic.  The pelvis was visualized and noted to be normal.  The rectus muscles and the anterior peritoneum was then closed with a running stitch of #1 Vicryl.  The subfascial areas were hemostatic.  The fascia was then closed with one suture of #1 Vicryl in a running fashion.  The subcutaneous tissue was then irrigated and made hemostatic using the Bovie cautery.  The skin reapproximated with staples and a sterile dressing applied.  Final sponge, needle, and instrument count was correct x 3.  There were no perioperative complications. Dictated by:   Willey Blade, M.D. Attending Physician:  Donne Hazel DD:  08/07/01 TD:  08/09/01 Job: [id number]

## 2011-02-16 NOTE — H&P (Signed)
[facility]  Patient:    Natasha Welch, Natasha Welch Visit Number: [id number] MRN: [id number]          Service Type: OBS Location: 910B 9198 02 Attending Physician:  Donne Hazel Dictated by:   Willey Blade, M.D. Admit Date:  08/07/2001                           History and Physical  HISTORY OF PRESENT ILLNESS:   Natasha Welch is a 30 year old female gravida 2 para 1 at term admitted for repeat cesarean section.  She had a primary cesarean section in 1999 for fetal macrosomia.  She was counseled regarding the risks and benefits of repeat cesarean section versus VBAC and she requested repeat.  Her pregnancy went well otherwise.  OBSTETRICAL LABORATORY DATA:  Maternal blood type A positive.  Rubella immune. Group B strep negative.  Glucola normal.  OBSTETRICAL HISTORY:          In 1999, a primary cesarean section at term, a female weighing 10 pounds - done secondary to fetal macrosomia.  MEDICAL HISTORY:              1. History of depression.                               2. History of smoking.  SURGICAL HISTORY:             1. Tonsillectomy.                               2. Cesarean section.  CURRENT MEDICATIONS:          Prenatal vitamins.  ALLERGIES:                    None known.  PHYSICAL EXAMINATION:  VITAL SIGNS:                  Stable.  Temperature 98.3, pulse 100, respirations 16, blood pressure 113/72, fetal heart tones 130s.  GENERAL:                      She is a well-developed, well-nourished female in no acute distress.  HEENT:                        Within normal limits.  NECK:                         Supple without adenopathy or thyromegaly.  HEART:                        Regular rate and rhythm without murmur, gallop, or rub.  LUNGS:                        Clear to auscultation.  BREAST:                       Exam done recently in the office was normal but is deferred upon admission.  ABDOMEN:                       Gravid and nontender.  EXTREMITIES AND NEUROLOGIC:   Grossly normal.  PELVIC:  Deferred.  ADMITTING DIAGNOSES:          1. Intrauterine pregnancy at term.                               2. Repeat cesarean section.  PLAN:                         Repeat cesarean section.  DISCUSSION:                   The risks and benefits of this surgery were explained to patient.  The risks of bleeding, infection, risk of injury to surrounding organs were reviewed.  The patient was also counseled regarding vaginal birth after cesarean.  Questions were answered. Dictated by:   Willey Blade, M.D. Attending Physician:  Donne Hazel DD:  08/07/01 TD:  08/07/01 Job: [id number]

## 2011-02-16 NOTE — Discharge Summary (Signed)
[facility]  Patient:    Natasha Welch, Natasha Welch Visit Number: [id number] MRN: [id number]          Service Type: OBS Location: 910A 9133 01 Attending Physician:  Donne Hazel Dictated by:   Danie Chandler, R.N. Admit Date:  08/07/2001 Discharge Date: 08/10/2001                             Discharge Summary  ADMITTING DIAGNOSES:          1. Intrauterine pregnancy at term.                               2. Repeat cesarean section.  DISCHARGE DIAGNOSES:          1. Intrauterine pregnancy at term.                               2. Repeat cesarean section.  PROCEDURE:                    On August 07, 2001: Repeat low transverse                               cesarean section.  REASON FOR ADMISSION:         Please see dictated H&P.  HOSPITAL COURSE:              The patient was taken to the operating room and underwent the above-named procedure without complication. This was productive of a viable female infant with Apgars of 8 at one minute and 9 at five minutes. Postoperatively on day #1, the patient was complaining of incisional pain. Her hemoglobin was 8.2, hematocrit 23.7, and white blood cell count 8.6. The patient was started on iron daily and a repeat CBC ordered for the following day. She was also restarted on her Prozac.  On postoperative day #2, the patient was without complaint. Her hemoglobin was 7.5. She was tolerating a regular diet and ambulating well without difficulty. She had better pain control. She was continued on her Prozac. On postoperative day #3 she was discharged home.  CONDITION ON DISCHARGE:       Good.  DIET:                         Regular as tolerated.  ACTIVITY:                     No heavy lifting, no driving, no vaginal entry.  DISCHARGE FOLLOWUP:           The patient is to follow up in the office in two weeks for incision check.  DISCHARGE FOLLOWUP:           The patient is to call for temperature  greater than 100 degrees, persistent nausea or vomiting, heavy vaginal bleeding, and/or redness or drainage from the incision site.  DISCHARGE MEDICATIONS:        1. Prenatal vitamin one p.o. q.d.                               2. Prozac 20 mg q.d.  3. Percocet 5 mg as directed by M.D. Dictated by:   Danie Chandler, R.N. Attending Physician:  Donne Hazel DD:  08/21/01 TD:  08/21/01 Job: [id number]

## 2011-07-06 LAB — CBC
HCT: 29.6 — ABNORMAL LOW
Platelets: 173
Platelets: 242
RDW: 12.2
RDW: 12.2

## 2011-07-06 LAB — TYPE AND SCREEN: Antibody Screen: NEGATIVE

## 2011-07-12 LAB — TYPE AND SCREEN

## 2011-07-12 LAB — CBC
MCHC: 34.4
MCV: 87.2
RBC: 4.69

## 2011-07-13 LAB — CBC
HCT: 37.5
Hemoglobin: 12.6
RBC: 4.33
WBC: 8.4

## 2011-07-13 LAB — URINALYSIS, ROUTINE W REFLEX MICROSCOPIC
Bilirubin Urine: NEGATIVE
Glucose, UA: NEGATIVE
Hgb urine dipstick: NEGATIVE
Ketones, ur: NEGATIVE
Nitrite: NEGATIVE
pH: 5.5

## 2011-07-13 LAB — DIFFERENTIAL
Basophils Absolute: 0.1
Basophils Relative: 1
Eosinophils Absolute: 0.3
Neutro Abs: 3.9
Neutrophils Relative %: 47

## 2011-07-13 LAB — COMPREHENSIVE METABOLIC PANEL
ALT: 9
Alkaline Phosphatase: 35 — ABNORMAL LOW
BUN: 10
CO2: 26
Chloride: 102
Glucose, Bld: 79
Potassium: 3.4 — ABNORMAL LOW
Total Bilirubin: 0.8

## 2011-07-13 LAB — WET PREP, GENITAL

## 2011-07-13 LAB — RPR: RPR Ser Ql: NONREACTIVE

## 2011-07-13 LAB — PREGNANCY, URINE: Preg Test, Ur: NEGATIVE

## 2013-02-17 ENCOUNTER — Encounter: Payer: Self-pay | Admitting: Radiology

## 2013-02-17 ENCOUNTER — Telehealth: Payer: Self-pay | Admitting: Family Medicine

## 2013-02-17 NOTE — Telephone Encounter (Signed)
She can take up to 800mg  tid with food.  If she hasn't maxed that out, then I would try that along with ice and heat.  We shouldn't call anything else since it has been so long since I've seen her and I need to check her back first.

## 2013-02-17 NOTE — Telephone Encounter (Signed)
Patient advised.  She states she has already maxed out the IBP and has done everything in terms of ice, heat, etc.  Will keep appt tomorrow.

## 2013-02-17 NOTE — Telephone Encounter (Signed)
Caller: Natasha Welch/Patient; Phone: [phone]; Reason for Call: Pt calling today 02/17/13 regarding she has scoliosis and normally takes Advil for her back pain.  Wants to know if MD can call in something stronger for her back pain until her appt which is already scheduled for tomorrow 02/18/13 at 2 PM.  Advil is not working.  Pharmacy is [facility], [phone].  PLEASE CALL PT BACK AT [phone] TO LET HER KNOW.  Thanks.  (Pt declines triage assessment, said coming in for appt tomorrow).

## 2013-02-18 ENCOUNTER — Encounter: Payer: Self-pay | Admitting: Family Medicine

## 2013-02-18 ENCOUNTER — Ambulatory Visit (INDEPENDENT_AMBULATORY_CARE_PROVIDER_SITE_OTHER): Payer: BC Managed Care – PPO | Admitting: Family Medicine

## 2013-02-18 VITALS — BP 100/70 | HR 72 | Temp 98.6°F | Wt 179.0 lb

## 2013-02-18 DIAGNOSIS — M545 Low back pain, unspecified: Secondary | ICD-10-CM

## 2013-02-18 MED ORDER — CYCLOBENZAPRINE HCL 10 MG PO TABS: 10.0000 mg | ORAL_TABLET | Freq: Three times a day (TID) | ORAL | Status: DC | PRN

## 2013-02-18 MED ORDER — HYDROCODONE-ACETAMINOPHEN 5-325 MG PO TABS: 1.0000 | ORAL_TABLET | Freq: Four times a day (QID) | ORAL | Status: DC | PRN

## 2013-02-18 NOTE — Patient Instructions (Addendum)
vicodin and flexeril for pain, sedation caution.  Keep stretching and using the heating pad.  Take the ibuprofen with food.  Notify us if not better.  Take care.

## 2013-02-18 NOTE — Assessment & Plan Note (Signed)
Acutely worse, continue stretching, ibuprofen with GI caution, heat.  Add on flexeril and vicodin with sedation caution.  Note given for work.  Call back as needed. She agrees.  No need to image today. Discussed.

## 2013-02-18 NOTE — Progress Notes (Signed)
Back pain. Known scoliosis. She has pain daily at baseline.  She'll usually get by with advil. Saturday she was doing laundry.  She twisted accidentally after the washing machine lid her R arm and since then the pain has been much worse.  She has B paraspinal pain in the upper back and pain near the B scapulas.  occ lower back.  No bowel or bladder changes.  No leg pain/sx now.  She'll get some relief with L leg extension.  She has to change position frequently.  No FCNAVD.    Meds, vitals, and allergies reviewed.   ROS: See HPI.  Otherwise, noncontributory.  nad but uncomfortable rrr ctab Neck supple but pain on ROM with rotation to the R, tender on R paracervical muscles.  Back w/o midline pain but she is ttp along the B T and L paraspinal muscles.  No rash Bruise noted on R forearm.  Mild scoliosis of back noted.  Distally nv intact

## 2013-02-26 ENCOUNTER — Other Ambulatory Visit: Payer: Self-pay

## 2013-02-26 MED ORDER — HYDROCODONE-ACETAMINOPHEN 5-325 MG PO TABS: 1.0000 | ORAL_TABLET | Freq: Three times a day (TID) | ORAL | Status: DC | PRN

## 2013-02-26 NOTE — Telephone Encounter (Signed)
Given #30 on 02/18/2013. plz phone in #20 and I will route note to Dr. Algis Downs.  To use only for breakthrough pain, try to cut back on use.  Use ibuprofen/flexeril first

## 2013-02-26 NOTE — Telephone Encounter (Signed)
Pt left v/m and knows Dr Para March is out of office but request refill hydrocodone apap to [facility].Please advise.

## 2013-02-27 NOTE — Telephone Encounter (Signed)
Pt called for status of refill. Patient notified as instructed by telephone.Medication phoned to [facility] pharmacy as instructed.

## 2013-03-01 NOTE — Telephone Encounter (Signed)
Noted, thanks!

## 2013-03-05 ENCOUNTER — Encounter: Payer: Self-pay | Admitting: Radiology

## 2013-03-06 ENCOUNTER — Encounter: Payer: Self-pay | Admitting: Family Medicine

## 2013-03-06 ENCOUNTER — Ambulatory Visit (INDEPENDENT_AMBULATORY_CARE_PROVIDER_SITE_OTHER): Payer: BC Managed Care – PPO | Admitting: Family Medicine

## 2013-03-06 VITALS — BP 108/72 | HR 84 | Temp 98.2°F | Wt 183.8 lb

## 2013-03-06 DIAGNOSIS — M545 Low back pain, unspecified: Secondary | ICD-10-CM

## 2013-03-06 DIAGNOSIS — M549 Dorsalgia, unspecified: Secondary | ICD-10-CM

## 2013-03-06 DIAGNOSIS — J019 Acute sinusitis, unspecified: Secondary | ICD-10-CM

## 2013-03-06 MED ORDER — AMOXICILLIN-POT CLAVULANATE 875-125 MG PO TABS: 1.0000 | ORAL_TABLET | Freq: Two times a day (BID) | ORAL | Status: DC

## 2013-03-06 MED ORDER — HYDROCODONE-ACETAMINOPHEN 5-325 MG PO TABS: 1.0000 | ORAL_TABLET | Freq: Three times a day (TID) | ORAL | Status: DC | PRN

## 2013-03-06 NOTE — Assessment & Plan Note (Signed)
Refer to Dr. Ethelene Hal for his opinion. I deferred imaging at this point, I'll defer to ortho.  Continue pain meds for now.

## 2013-03-06 NOTE — Patient Instructions (Addendum)
Start the antibiotics today, use the pain medicine and see Shirlee Limerick about your referral before you leave today. Take care.

## 2013-03-06 NOTE — Progress Notes (Signed)
She has had back pain for years.  Known scoliosis and has been through PT prev.  We talked about options.  vicodin helps some.  She had a flare recently after twisting and shifting her weight. She continues to have pain lifting.  Lower back paraspinal pain is more troubling than upper back pain.    She started with what she thought was allergies about 2 weeks ago.  Progressive, more facial pain.  Voice is altered.  Ear pain noted.  No fevers. Some cough.  Some sputum.  Some rhinorrhea, discolored and bloody. More HA/faical pain leaning over, better in a hot shower.    Meds, vitals, and allergies reviewed.   ROS: See HPI.  Otherwise, noncontributory.  GEN: nad, alert and oriented HEENT: mucous membranes moist, tm w/o erythema, nasal exam w/o erythema, clear discharge noted,  OP with cobblestoning, sinuses ttp NECK: supple w/o LA CV: rrr.   PULM: ctab, no inc wob EXT: no edema SKIN: no acute rash No weakness in BLE but she is uncomfortable from the pain. Repeat back exam deferred due to pain.

## 2013-03-06 NOTE — Assessment & Plan Note (Signed)
Augementin, nontoxic.  Fu prn. Supportive care o/w. D/w pt.

## 2013-03-11 ENCOUNTER — Telehealth: Payer: Self-pay | Admitting: Family Medicine

## 2013-03-11 NOTE — Telephone Encounter (Signed)
Patient Information:  Caller Name: Ayana  Phone: [phone]  Patient: Natasha Welch  Gender: Female  DOB: 1981-08-25  Age: 32 Years  PCP: Crawford Givens Clelia Croft) [facility])  Pregnant: No  Office Follow Up:  Does the office need to follow up with this patient?: Yes  Instructions For The Office: Caller asking for a RX for yeast infection d/t antibiotic tx.  Pt was informed of profile/standing orders. Thank you.  RN Note:  RN advised of policy and to call back if sx present > 3 days after home care tx.  Caller asked to submit request.  Symptoms  Reason For Call & Symptoms: Vaginal yeast sx d/t being on Augementin for a sinus infection. Caller requesting a RX to treat yeast infection.  Pt voiced OTC medication doesn't clear up her yeast infections and she ends up needing a prescribed medication.     Reviewed Health History In EMR: Yes  Reviewed Medications In EMR: Yes  Reviewed Allergies In EMR: Yes  Reviewed Surgeries / Procedures: Yes  Date of Onset of Symptoms: 03/10/2013  Treatments Tried: Monistat x2 x day.  Pt treated 6/10 and 6/11  Treatments Tried Worked: No OB / GYN:  LMP: Unknown/Hyster  Guideline(s) Used:  Vaginal Yeast Infection Vaginal Discharge  Disposition Per Guideline:   Home Care  Reason For Disposition Reached:   Symptoms of a vaginal yeast infection (i.e., white, thick, cottage-cheese-like, itchy, not bad smelling discharge)  Advice Given:  Antifungal Medication for Vaginal Yeast Infection:   Available in the [place]: Femstat-3, miconazole (Monistat-3), clotrimazole (Gyne-Lotrimin-3, Mycelex-7), butoconazole (Femstat-3).  Call Back If:  There is no improvement after treating yourself for a vaginal yeast infection  You become worse.  Patient Will Follow Care Advice:  YES

## 2013-03-11 NOTE — Telephone Encounter (Signed)
In case she doesn't improve with home care, please call in rx for fluconazole 150mg  po x1, repeat in 1 week if needed.  #2, no rf.  Thanks.

## 2013-03-12 ENCOUNTER — Other Ambulatory Visit: Payer: Self-pay | Admitting: *Deleted

## 2013-03-12 MED ORDER — FLUCONAZOLE 150 MG PO TABS: 150.0000 mg | ORAL_TABLET | Freq: Once | ORAL | Status: DC

## 2013-03-12 NOTE — Telephone Encounter (Signed)
Left detailed message on voicemail.  Medication phoned to pharmacy.  

## 2013-06-28 ENCOUNTER — Encounter: Payer: Self-pay | Admitting: Family Medicine

## 2013-12-03 ENCOUNTER — Ambulatory Visit (INDEPENDENT_AMBULATORY_CARE_PROVIDER_SITE_OTHER): Payer: BC Managed Care – PPO | Admitting: Family Medicine

## 2013-12-03 ENCOUNTER — Encounter: Payer: Self-pay | Admitting: Family Medicine

## 2013-12-03 VITALS — BP 102/74 | HR 81 | Temp 98.2°F | Wt 205.2 lb

## 2013-12-03 DIAGNOSIS — F4320 Adjustment disorder, unspecified: Secondary | ICD-10-CM

## 2013-12-03 MED ORDER — FLUOXETINE HCL 20 MG PO TABS: 20.0000 mg | ORAL_TABLET | Freq: Every day | ORAL | Status: DC

## 2013-12-03 MED ORDER — ALPRAZOLAM 0.5 MG PO TABS: 0.2500 mg | ORAL_TABLET | Freq: Two times a day (BID) | ORAL | Status: DC | PRN
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
## 2013-12-03 NOTE — Patient Instructions (Signed)
Take the prozac daily.  Use the xanax if needed, start with a 1/2 pill- sedation caution.  Update me next week.  I'll check with Dr. Laymond PurserPerrin in the meantime.

## 2013-12-04 NOTE — Assessment & Plan Note (Signed)
Profound upheaval.  I'll check with Dr. Laymond PurserPerrin. In meantime, restart SSRI with prn BZD with routine cautions.  She agrees.  Okay for outpatient f/u.  She'll update me.

## 2013-12-08 ENCOUNTER — Telehealth: Payer: Self-pay | Admitting: Family Medicine

## 2013-12-08 NOTE — Telephone Encounter (Signed)
I have given information to Dr. Perrin verballLaymond Pursery as allowed by patient. Dr. Laymond PurserPerrin didn't share info with me.

## 2014-01-20 ENCOUNTER — Ambulatory Visit (INDEPENDENT_AMBULATORY_CARE_PROVIDER_SITE_OTHER): Payer: BC Managed Care – PPO | Admitting: Psychology

## 2014-01-20 DIAGNOSIS — F4322 Adjustment disorder with anxiety: Secondary | ICD-10-CM

## 2014-03-17 ENCOUNTER — Ambulatory Visit (INDEPENDENT_AMBULATORY_CARE_PROVIDER_SITE_OTHER): Payer: BC Managed Care – PPO | Admitting: Psychology

## 2014-03-17 DIAGNOSIS — F4323 Adjustment disorder with mixed anxiety and depressed mood: Secondary | ICD-10-CM

## 2014-04-14 ENCOUNTER — Ambulatory Visit (INDEPENDENT_AMBULATORY_CARE_PROVIDER_SITE_OTHER): Payer: BC Managed Care – PPO | Admitting: Psychology

## 2014-04-14 DIAGNOSIS — F4323 Adjustment disorder with mixed anxiety and depressed mood: Secondary | ICD-10-CM

## 2014-04-26 ENCOUNTER — Other Ambulatory Visit: Payer: Self-pay | Admitting: Family Medicine

## 2014-04-26 NOTE — Telephone Encounter (Signed)
Electronic refill request. Last Filled:   30 tablet 1 RF on  12/03/2013.    Please advise.

## 2014-04-27 NOTE — Telephone Encounter (Signed)
Please call in.  Thanks.   

## 2014-04-27 NOTE — Telephone Encounter (Signed)
Medication phoned to pharmacy.  

## 2014-05-11 ENCOUNTER — Ambulatory Visit (INDEPENDENT_AMBULATORY_CARE_PROVIDER_SITE_OTHER): Payer: BC Managed Care – PPO | Admitting: Psychology

## 2014-05-11 DIAGNOSIS — F4323 Adjustment disorder with mixed anxiety and depressed mood: Secondary | ICD-10-CM

## 2014-06-01 ENCOUNTER — Ambulatory Visit (INDEPENDENT_AMBULATORY_CARE_PROVIDER_SITE_OTHER): Payer: 59 | Admitting: Family Medicine

## 2014-06-01 ENCOUNTER — Encounter: Payer: Self-pay | Admitting: Family Medicine

## 2014-06-01 VITALS — BP 102/70 | HR 85 | Temp 97.9°F | Wt 212.8 lb

## 2014-06-01 DIAGNOSIS — J0100 Acute maxillary sinusitis, unspecified: Secondary | ICD-10-CM

## 2014-06-01 MED ORDER — BENZONATATE 200 MG PO CAPS: 200.0000 mg | ORAL_CAPSULE | Freq: Three times a day (TID) | ORAL | Status: DC | PRN

## 2014-06-01 MED ORDER — AMOXICILLIN-POT CLAVULANATE 875-125 MG PO TABS: 1.0000 | ORAL_TABLET | Freq: Two times a day (BID) | ORAL | Status: DC

## 2014-06-01 NOTE — Patient Instructions (Signed)
Drink plenty of fluids, take tessalon as needed, and gargle with warm salt water for your throat.  Start the antibiotics today.  This should gradually improve.  Take care.  Let us know if you have other concerns.

## 2014-06-01 NOTE — Progress Notes (Signed)
Pre visit review using our clinic review tool, if applicable. No additional management support is needed unless otherwise documented below in the visit note.  Ear pain recently, then sleep disrupted.  ST, scratchy throat.  Taking OTC cold meds. ST is most bothersome, along with L maxillary pain.  L>R ear pain. No high fevers, already on ibuprofen.  Sx for a few days.   Some cough, variable, more at night.  No sputum.  No rash.    Meds, vitals, and allergies reviewed.   ROS: See HPI.  Otherwise, noncontributory.  GEN: nad, alert and oriented HEENT: mucous membranes moist, tm w/o erythema, nasal exam w/o erythema, clear discharge noted,  OP with cobblestoning, L max sinus notably ttp NECK: supple w/o LA CV: rrr.   PULM: ctab, no inc wob EXT: no edema SKIN: no acute rash

## 2014-06-02 DIAGNOSIS — J019 Acute sinusitis, unspecified: Secondary | ICD-10-CM | POA: Insufficient documentation

## 2014-06-02 NOTE — Assessment & Plan Note (Signed)
Nontoxic, tessalon, augmentin, f/u prn.  D/w pt.  She agrees.

## 2014-07-20 ENCOUNTER — Other Ambulatory Visit: Payer: Self-pay | Admitting: Family Medicine

## 2014-07-27 ENCOUNTER — Ambulatory Visit: Payer: Self-pay | Admitting: Physical Medicine and Rehabilitation

## 2014-07-29 ENCOUNTER — Encounter: Payer: Self-pay | Admitting: Family Medicine

## 2014-07-29 ENCOUNTER — Encounter: Payer: Self-pay | Admitting: *Deleted

## 2014-07-29 ENCOUNTER — Ambulatory Visit (INDEPENDENT_AMBULATORY_CARE_PROVIDER_SITE_OTHER): Payer: 59 | Admitting: Family Medicine

## 2014-07-29 VITALS — BP 114/74 | HR 72 | Temp 98.2°F | Wt 215.2 lb

## 2014-07-29 DIAGNOSIS — R21 Rash and other nonspecific skin eruption: Secondary | ICD-10-CM

## 2014-07-29 DIAGNOSIS — F988 Other specified behavioral and emotional disorders with onset usually occurring in childhood and adolescence: Secondary | ICD-10-CM | POA: Insufficient documentation

## 2014-07-29 DIAGNOSIS — F909 Attention-deficit hyperactivity disorder, unspecified type: Secondary | ICD-10-CM

## 2014-07-29 MED ORDER — AMPHETAMINE-DEXTROAMPHETAMINE 10 MG PO TABS: 5.0000 mg | ORAL_TABLET | Freq: Two times a day (BID) | ORAL | Status: DC

## 2014-07-29 MED ORDER — METRONIDAZOLE 1 % EX GEL: Freq: Every day | CUTANEOUS | Status: DC

## 2014-07-29 NOTE — Assessment & Plan Note (Signed)
Likely rosacea, mild, will try metrogel and update me as needed.  She agrees.

## 2014-07-29 NOTE — Patient Instructions (Addendum)
This looks like mild rosacea.  Try the metrogel and notify me if not better.   Get me a copy of your UDS from work.  Take a half tab of adderall initially, working up to 1 tab twice a day if needed.  Update me in about 1-2 weeks, sooner if needed.

## 2014-07-29 NOTE — Assessment & Plan Note (Addendum)
H/o, with worsening sx.  She has tried environmental changes, lists, etc to help.  Still with sig sx. D/w pt about med options and side effects.  Reasonable to restart adderall at 5mg  a day, can slowly work up to 10mg  bid if needed.  She'll update me.  She did a UDS this year at work. That will suffice for our purposes and she'll get me a copy of that.   Addendum 08/02/14.  UDS done at work reviewed, as expected negative, scanned.

## 2014-07-29 NOTE — Progress Notes (Signed)
Pre visit review using our clinic review tool, if applicable. No additional management support is needed unless otherwise documented below in the visit note.  Still seeing Dr. Ethelene Halamos' clinic re: back pain.    Dry skin on nose.  Irritation, can get red and flaky, scaling.  Tried vaseline, neosporin, cortisone, OTC moisturizers.  In the nasal creases B.    H/o ADD.  "I'm aware that I've been ADD for quite a while, but the new job if overwhelming."  "I'm a perfectionist."  She is having trouble concentrating, easily distracted.  She enjoys the job, wants to do well.  Trouble at home, can't focus on cleaning w/o getting distracted, with a chain reaction that leads to ineffective work.  She was tested in high school, had positive testing.  She took adderall or similar, stopped soon after with pregnancy.  The med helped.  H/o speeding tickets.  She drinks caffeine daily.  She did a UDS this year at work.  That will suffice for our purposes and she'll get me a copy of that.   Meds, vitals, and allergies reviewed.   ROS: See HPI.  Otherwise, noncontributory.  GEN: nad, alert and oriented HEENT: mucous membranes moist NECK: supple w/o LA CV: rrr.  no murmur PULM: ctab, no inc wob ABD: soft, +bs EXT: no edema SKIN: no acute rash except for irritated skin on the B nasal creases on the face CN 2-12 wnl B, S/S/DTR wnl x4

## 2014-09-22 ENCOUNTER — Encounter: Payer: Self-pay | Admitting: Family Medicine

## 2014-09-22 ENCOUNTER — Ambulatory Visit (INDEPENDENT_AMBULATORY_CARE_PROVIDER_SITE_OTHER): Payer: 59 | Admitting: Family Medicine

## 2014-09-22 VITALS — BP 114/78 | HR 80 | Temp 98.5°F | Ht 65.0 in | Wt 218.0 lb

## 2014-09-22 DIAGNOSIS — F988 Other specified behavioral and emotional disorders with onset usually occurring in childhood and adolescence: Secondary | ICD-10-CM

## 2014-09-22 DIAGNOSIS — M545 Low back pain: Secondary | ICD-10-CM

## 2014-09-22 DIAGNOSIS — Z0000 Encounter for general adult medical examination without abnormal findings: Secondary | ICD-10-CM

## 2014-09-22 MED ORDER — FLUOXETINE HCL 20 MG PO CAPS: ORAL_CAPSULE | ORAL | Status: DC

## 2014-09-22 MED ORDER — ALPRAZOLAM 0.5 MG PO TABS: ORAL_TABLET | ORAL | Status: DC

## 2014-09-22 MED ORDER — AMPHETAMINE-DEXTROAMPHETAMINE 10 MG PO TABS: 5.0000 mg | ORAL_TABLET | Freq: Two times a day (BID) | ORAL | Status: DC

## 2014-09-22 NOTE — Patient Instructions (Signed)
Take care.  Glad to see you.  Let me know if I can help.  I'll wait to hear about your surgery consult.

## 2014-09-22 NOTE — Progress Notes (Signed)
Pre visit review using our clinic review tool, if applicable. No additional management support is needed unless otherwise documented below in the visit note.  CPE- See plan.  Routine anticipatory guidance given to patient.  See health maintenance. Tetanus and flu up to date.  PNA and shingles not due, d/w pt.  Not due for pap or mammogram or DXA, d/w pt.  Living will d/w pt- mother designated if patient were incapacitated.  D/w pt about exercise- limited with inc in pain, d/w pt.   Handwritten order done for lipids and sugar.   Back pain per Dr. Ethelene Halamos.  Surgery eval pending for 10/2014.   ADD.  Started back on meds, sig help.  No ADE on meds. Much improved concentration.  Taking ~15mg  per day in total.   Still with sig social upheaval.  Has court date pending about custody for her kids.  Safe at home.  She has plans to eventually get back to counseling.  She gave me permission to discuss with Dr. Laymond PurserPerrin.   PMH and SH reviewed  Meds, vitals, and allergies reviewed.   ROS: See HPI.  Otherwise negative.    GEN: nad, alert and oriented HEENT: mucous membranes moist NECK: supple w/o LA CV: rrr. PULM: ctab, no inc wob ABD: soft, +bs EXT: no edema SKIN: no acute rash Back not examined.

## 2014-09-26 DIAGNOSIS — Z0000 Encounter for general adult medical examination without abnormal findings: Secondary | ICD-10-CM | POA: Insufficient documentation

## 2014-09-26 NOTE — Assessment & Plan Note (Signed)
Much improved on med, no ADE, continue as is.  She agrees.

## 2014-09-26 NOTE — Assessment & Plan Note (Signed)
Await surgery input.

## 2014-09-26 NOTE — Assessment & Plan Note (Signed)
Routine anticipatory guidance given to patient.  See health maintenance. Tetanus and flu up to date.  PNA and shingles not due, d/w pt.  Not due for pap or mammogram or DXA, d/w pt.  Living will d/w pt- mother designated if patient were incapacitated.  D/w pt about exercise- limited with inc in pain, d/w pt.   Handwritten order done for lipids and sugar.

## 2014-11-02 ENCOUNTER — Other Ambulatory Visit: Payer: Self-pay

## 2014-11-02 NOTE — Telephone Encounter (Signed)
Pt left v/m requesting rx for Adderall. Call when ready for pick up. Pt last seen 09/22/14.

## 2014-11-03 MED ORDER — AMPHETAMINE-DEXTROAMPHETAMINE 10 MG PO TABS: 5.0000 mg | ORAL_TABLET | Freq: Two times a day (BID) | ORAL | Status: DC

## 2014-11-03 NOTE — Telephone Encounter (Signed)
Patient notified by telephone that script is up front and ready for pickup. 

## 2014-11-03 NOTE — Telephone Encounter (Signed)
Please print and I'll sign as is. Thanks.

## 2014-12-28 ENCOUNTER — Other Ambulatory Visit: Payer: Self-pay | Admitting: Family Medicine

## 2014-12-28 NOTE — Telephone Encounter (Signed)
Electronic refill request. Last Filled:    30 tablet 2 RF on 09/22/2014  Please advise.

## 2014-12-29 NOTE — Telephone Encounter (Signed)
Xanax called into [facility] per Dr Para Marchuncan ok.

## 2014-12-29 NOTE — Telephone Encounter (Signed)
Please call in.  Thanks.   

## 2015-01-04 ENCOUNTER — Other Ambulatory Visit: Payer: Self-pay | Admitting: *Deleted

## 2015-01-04 NOTE — Telephone Encounter (Signed)
Patient called requesting a refill on her Adderall. Last refill 11/03/14 #60, last office visit 09/22/14. Call patient when ready for pickup.

## 2015-01-05 MED ORDER — AMPHETAMINE-DEXTROAMPHETAMINE 10 MG PO TABS: 5.0000 mg | ORAL_TABLET | Freq: Two times a day (BID) | ORAL | Status: DC

## 2015-01-05 NOTE — Telephone Encounter (Signed)
Printed.  Thanks.  

## 2015-01-05 NOTE — Telephone Encounter (Signed)
Patient notified by telephone that script is up front ready for pickup. 

## 2015-01-06 ENCOUNTER — Encounter: Payer: Self-pay | Admitting: Family Medicine

## 2015-01-06 ENCOUNTER — Ambulatory Visit (INDEPENDENT_AMBULATORY_CARE_PROVIDER_SITE_OTHER): Payer: 59 | Admitting: Family Medicine

## 2015-01-06 VITALS — BP 108/70 | HR 88 | Temp 98.5°F | Wt 209.5 lb

## 2015-01-06 DIAGNOSIS — M25561 Pain in right knee: Secondary | ICD-10-CM | POA: Diagnosis not present

## 2015-01-06 DIAGNOSIS — M25569 Pain in unspecified knee: Secondary | ICD-10-CM | POA: Insufficient documentation

## 2015-01-06 MED ORDER — IBUPROFEN 800 MG PO TABS: 800.0000 mg | ORAL_TABLET | Freq: Three times a day (TID) | ORAL | Status: DC | PRN

## 2015-01-06 NOTE — Progress Notes (Signed)
Pre visit review using our clinic review tool, if applicable. No additional management support is needed unless otherwise documented below in the visit note.  Started walking, signed up for a 5K.  Had increased up to 1.5 hours.  Was sore after a recent walk.  She tried to walk again but the the pain got worse.  R knee.  She feels a popping.  Isn't locking, occ clicking.  Pain is anterior not posterior.  She can still bend the knee but knee flexion and hip flexion makes the pain worse.  Has tried ice and heat.  No trauma.  No bruising, not puffy.    Meds, vitals, and allergies reviewed.   ROS: See HPI.  Otherwise, noncontributory.  nad but uncomfortable from R knee pain.  R knee with normal inspection.  Not bruised or puffy.  Normal ROM but pain pain with extremes of ROM.  ACL MCL LCL feel solid.   ttp at the pes anserine bursa and the quad ligament.   Joint line and popliteal area not ttp.

## 2015-01-06 NOTE — Assessment & Plan Note (Signed)
Likely relative overuse, pes anserine bursitis +/- quad tendonitis.  Relative rest.  naids with caution, ice, try a knee sleeve.  D/w pt.  F/u prn, may need Dr. Cyndie Chimeopland's input if not better.  She agrees.  Gradual return to exercise.

## 2015-01-06 NOTE — Patient Instructions (Signed)
Ice, ibuprofen with food, try a knee sleeve, sleep with a pillow between your legs.   If not better, then ask about seeing Dr. Patsy Lageropland.

## 2015-02-10 ENCOUNTER — Other Ambulatory Visit: Payer: Self-pay

## 2015-02-10 MED ORDER — AMPHETAMINE-DEXTROAMPHETAMINE 10 MG PO TABS: 5.0000 mg | ORAL_TABLET | Freq: Two times a day (BID) | ORAL | Status: DC

## 2015-02-10 NOTE — Telephone Encounter (Signed)
Pt left v/m requesting rx for Adderall. Call when ready for pick up. Pt last seen 09/22/14 for annual exam; rx last printed on 01/05/15. Pt would like to pick up 02/10/15 since office has extended hours today.

## 2015-02-10 NOTE — Telephone Encounter (Signed)
I try to do these ASAP, but I still need at least 24 hours.  Printed.  Thanks.

## 2015-02-11 NOTE — Telephone Encounter (Signed)
Left detailed message on voicemail. Rx left at front desk for pick up.  

## 2015-02-14 ENCOUNTER — Encounter: Payer: Self-pay | Admitting: *Deleted

## 2015-02-26 ENCOUNTER — Encounter: Payer: Self-pay | Admitting: Emergency Medicine

## 2015-02-26 ENCOUNTER — Emergency Department
Admission: EM | Admit: 2015-02-26 | Discharge: 2015-02-26 | Disposition: A | Discharge status: Home / Self Care | Payer: 59 | Attending: Emergency Medicine | Admitting: Emergency Medicine

## 2015-02-26 DIAGNOSIS — Z792 Long term (current) use of antibiotics: Secondary | ICD-10-CM | POA: Insufficient documentation

## 2015-02-26 DIAGNOSIS — R6884 Jaw pain: Secondary | ICD-10-CM | POA: Diagnosis present

## 2015-02-26 DIAGNOSIS — Z79899 Other long term (current) drug therapy: Secondary | ICD-10-CM | POA: Insufficient documentation

## 2015-02-26 DIAGNOSIS — M2662 Arthralgia of temporomandibular joint: Secondary | ICD-10-CM | POA: Insufficient documentation

## 2015-02-26 DIAGNOSIS — Z87891 Personal history of nicotine dependence: Secondary | ICD-10-CM | POA: Diagnosis not present

## 2015-02-26 DIAGNOSIS — M26629 Arthralgia of temporomandibular joint, unspecified side: Secondary | ICD-10-CM

## 2015-02-26 DIAGNOSIS — K047 Periapical abscess without sinus: Secondary | ICD-10-CM | POA: Diagnosis not present

## 2015-02-26 MED ORDER — AMOXICILLIN-POT CLAVULANATE 875-125 MG PO TABS
ORAL_TABLET | ORAL | Status: AC
  Administered 2015-02-26: 1 via ORAL
  Filled 2015-02-26: qty 1

## 2015-02-26 MED ORDER — KETOROLAC TROMETHAMINE 10 MG PO TABS: 10.0000 mg | ORAL_TABLET | Freq: Four times a day (QID) | ORAL | Status: DC | PRN

## 2015-02-26 MED ORDER — AMOXICILLIN-POT CLAVULANATE 875-125 MG PO TABS
1.0000 | ORAL_TABLET | Freq: Once | ORAL | Status: AC
  Administered 2015-02-26: 1 via ORAL

## 2015-02-26 MED ORDER — KETOROLAC TROMETHAMINE 30 MG/ML IJ SOLN
30.0000 mg | Freq: Once | INTRAMUSCULAR | Status: AC
  Administered 2015-02-26: 30 mg via INTRAVENOUS

## 2015-02-26 MED ORDER — KETOROLAC TROMETHAMINE 30 MG/ML IJ SOLN
INTRAMUSCULAR | Status: AC
  Filled 2015-02-26: qty 1

## 2015-02-26 MED ORDER — HYDROMORPHONE HCL 1 MG/ML IJ SOLN
INTRAMUSCULAR | Status: AC
  Administered 2015-02-26: 0.5 mg via INTRAVENOUS
  Filled 2015-02-26: qty 1

## 2015-02-26 MED ORDER — HYDROMORPHONE HCL 1 MG/ML IJ SOLN
0.5000 mg | Freq: Once | INTRAMUSCULAR | Status: AC
Start: 2015-02-26 — End: 2015-02-26
  Administered 2015-02-26: 0.5 mg via INTRAVENOUS

## 2015-02-26 MED ORDER — AMOXICILLIN-POT CLAVULANATE 875-125 MG PO TABS: 1.0000 | ORAL_TABLET | Freq: Two times a day (BID) | ORAL | Status: AC

## 2015-02-26 MED ORDER — DEXAMETHASONE SODIUM PHOSPHATE 10 MG/ML IJ SOLN
10.0000 mg | Freq: Once | INTRAMUSCULAR | Status: AC
  Administered 2015-02-26: 10 mg via INTRAVENOUS

## 2015-02-26 MED ORDER — DEXAMETHASONE SODIUM PHOSPHATE 10 MG/ML IJ SOLN
INTRAMUSCULAR | Status: AC
  Administered 2015-02-26: 10 mg via INTRAVENOUS
  Filled 2015-02-26: qty 1

## 2015-02-26 NOTE — ED Provider Notes (Signed)
[facility] Emergency Department Provider Note  ____________________________________________  Time seen: Approximately 2150 I have reviewed the triage vital signs and the nursing notes.   HISTORY  Chief Complaint Jaw Pain    HPI Natasha Welch is a 34 y.o. female 34 year old female presenting with left-sided jaw pain and swelling worsening todayshe thinks she has a dental infection due to swelling around her jaw but it's also causing her TMJ pain to flare up she says she's having trouble opening her mouth wide chewing she is having no difficulty swallowing controlling her secretions rates pain as about 8 out of 10 denies any fevers chills other symptoms of note denies any trauma   Past Medical History  Diagnosis Date  . Endometriosis   . Scoliosis     facet injection 2014  . Migraine   . Anxiety     adjustment/anxiety related to marriage/divorce 2011  . ADD (attention deficit disorder)     Patient Active Problem List   Diagnosis Date Noted  . Knee pain 01/06/2015  . Routine general medical examination at a health care facility 09/26/2014  . ADD (attention deficit disorder) 07/29/2014  . Facial rash 07/29/2014  . ADJUSTMENT DISORDER 10/05/2010  . COMMON MIGRAINE 09/27/2009  . HYPOGLYCEMIA, HX OF 09/27/2009  . LOW BACK PAIN, ACUTE 08/31/2009    Past Surgical History  Procedure Laterality Date  . Cesarean section    . Abdominal hysterectomy    . Breast surgery  2012    Current Outpatient Rx  Name  Route  Sig  Dispense  Refill  . ALPRAZolam (XANAX) 0.5 MG tablet      TAKE 1/2 TO 1 TABLET BY MOUTH TWICE A DAY AS NEEDED FOR ANXIETY   30 tablet   2   . amoxicillin-clavulanate (AUGMENTIN) 875-125 MG per tablet   Oral   Take 1 tablet by mouth every 12 (twelve) hours.   20 tablet   0   . amphetamine-dextroamphetamine (ADDERALL) 10 MG tablet   Oral   Take 0.5-1 tablets (5-10 mg total) by mouth 2 (two) times daily.   60 tablet   0   .  FLUoxetine (PROZAC) 20 MG capsule      TAKE ONE (1) CAPSULE BY MOUTH EACH DAY   90 capsule   3   . ibuprofen (ADVIL,MOTRIN) 800 MG tablet   Oral   Take 1 tablet (800 mg total) by mouth every 8 (eight) hours as needed (for pain).   30 tablet   1   . ketorolac (TORADOL) 10 MG tablet   Oral   Take 1 tablet (10 mg total) by mouth every 6 (six) hours as needed.   20 tablet   0   . loratadine (ALAVERT) 10 MG tablet   Oral   Take 10 mg by mouth daily as needed for allergies.         . metroNIDAZOLE (METROGEL) 1 % gel   Topical   Apply topically daily.   45 g   0   . oxyCODONE-acetaminophen (PERCOCET) 10-325 MG per tablet   Oral   Take 1 tablet by mouth. Take by mouth every 4- 6 hours as needed, not to exceed 4 daily           Allergies Tetanus toxoid  Family History  Problem Relation Age of Onset  . Diabetes Father   . Hyperlipidemia Father   . Hypertension Father     Social History History  Substance Use Topics  . Smoking status:  Former Smoker  . Smokeless tobacco: Never Used     Comment: quit 2009  . Alcohol Use: 0.0 oz/week    0 Standard drinks or equivalent per week     Comment: 1-2 times per month    Review of Systems Constitutional: No fever/chills Eyes: No visual changes. ENT: No sore throat. Cardiovascular: Denies chest pain. Respiratory: Denies shortness of breath. Gastrointestinal: No abdominal pain.  No nausea, no vomiting.  No diarrhea.  No constipation. Genitourinary: Negative for dysuria. Musculoskeletal: Negative for back pain. Skin: Negative for rash. Neurological: Negative for headaches, focal weakness or numbness.  10-point ROS otherwise negative.  ____________________________________________   PHYSICAL EXAM:  VITAL SIGNS: ED Triage Vitals  Enc Vitals Group     BP 02/26/15 2131 116/85 mmHg     Pulse Rate 02/26/15 2131 96     Resp 02/26/15 2131 18     Temp 02/26/15 2131 98.2 F (36.8 C)     Temp Source 02/26/15 2131 Oral      SpO2 02/26/15 2131 100 %     Weight 02/26/15 2131 193 lb (87.544 kg)     Height 02/26/15 2131  (1.676 m)     Head Cir --      Peak Flow --      Pain Score 02/26/15 2133 9     Pain Loc --      Pain Edu? --      Excl. in GC? --     Constitutional: Alert and oriented. Well appearing and in no acute distress. Eyes: Conjunctivae are normal. PERRL. EOMI. Head: Atraumatic. Nose: No congestion/rhinnorhea. Mouth/Throat: Mucous membranes are moist.  Oropharynx non-erythematous. Mild swelling around her left jaw angle tenderness to palpation to the TMJ swelling around the gums at the last molar patient will open her mouth completely to fully evaluate which according to the patient is more due to pain and functional deficit Neck: No stridor.   Cardiovascular: Normal rate, regular rhythm. Grossly normal heart sounds.  Good peripheral circulation. Respiratory: Normal respiratory effort.  No retractions. Lungs CTAB. Musculoskeletal: No lower extremity tenderness nor edema.  No joint effusions. Neurologic:  Normal speech and language. No gross focal neurologic deficits are appreciated. Speech is normal. No gait instability. Skin:  Skin is warm, dry and intact. No rash noted. Psychiatric: Mood and affect are normal. Speech and behavior are normal.  ____________________________________________    PROCEDURES  Procedure(s) performed: None  Critical Care performed: No  ____________________________________________   INITIAL IMPRESSION / ASSESSMENT AND PLAN / ED COURSE  Pertinent labs & imaging results that were available during my care of the patient were reviewed by me and considered in my medical decision making (see chart for details).  impression of this patient is dental infection as well as temporomandibular joint pain the TMJ pain as chronic seems to be worsened by the swelling and infection around her tooth will start her on anti- biotics Toradol showed has pain medication at  home she's follow-up with her primary care provider next week and a dentist as soon as possible patient was given Toradol Decadron and Dilaudid given Augmentin for infection control ____________________________________________   FINAL CLINICAL IMPRESSION(S) / ED DIAGNOSES  Final diagnoses:  TMJ arthralgia  Dental infection     Memory Heinrichs Rosalyn Gess, PA-C 02/26/15 2305  Darien Ramus, MD 02/27/15 0010

## 2015-02-26 NOTE — ED Notes (Signed)
Painful with movement, visibly swollen

## 2015-03-16 ENCOUNTER — Other Ambulatory Visit: Payer: Self-pay

## 2015-03-16 MED ORDER — AMPHETAMINE-DEXTROAMPHETAMINE 10 MG PO TABS: 5.0000 mg | ORAL_TABLET | Freq: Two times a day (BID) | ORAL | Status: DC

## 2015-03-16 NOTE — Telephone Encounter (Signed)
Pt left v/m requesting rx for Adderall. Call when ready for pick up. Last annual exam 09/22/14 and rx last printed # 60 on 02/10/15.

## 2015-03-16 NOTE — Telephone Encounter (Signed)
Printed.  Thanks.  

## 2015-03-17 NOTE — Telephone Encounter (Signed)
Patient advised.  Rx left at front desk for pick up. 

## 2015-04-28 ENCOUNTER — Other Ambulatory Visit: Payer: Self-pay

## 2015-04-28 NOTE — Telephone Encounter (Signed)
Pt left v/m requesting rx for Adderall. Call when ready for pick up. Pt request 90 day rx if possible. rx last printed # 60 on 03/16/15 and last annual exam 09/22/14.Marland KitchenPlease advise.

## 2015-04-29 MED ORDER — AMPHETAMINE-DEXTROAMPHETAMINE 10 MG PO TABS: 5.0000 mg | ORAL_TABLET | Freq: Two times a day (BID) | ORAL | Status: DC

## 2015-04-29 NOTE — Telephone Encounter (Signed)
Patient advised.  Rx left at front desk for pick up. 

## 2015-04-29 NOTE — Telephone Encounter (Signed)
Printed. I think the pharmacy will take 90 day rx, not just a 3x30 day supply.  Thanks.

## 2015-06-15 ENCOUNTER — Other Ambulatory Visit: Payer: Self-pay | Admitting: Family Medicine

## 2015-06-15 NOTE — Telephone Encounter (Signed)
Ok to refill? Last filled 12/29/14 and last OV 01/06/15 for knee pain.

## 2015-06-15 NOTE — Telephone Encounter (Signed)
Rx called in as directed.   

## 2015-06-15 NOTE — Telephone Encounter (Signed)
Please call in.  Thanks.   

## 2015-08-09 ENCOUNTER — Other Ambulatory Visit: Payer: Self-pay

## 2015-08-09 NOTE — Telephone Encounter (Signed)
Pt left v/m requesting 90 day rx for Adderall. Call when ready for pick up. rx last printed # 180 on 04/29/15. Last annual exam on 09/22/14.

## 2015-08-10 MED ORDER — AMPHETAMINE-DEXTROAMPHETAMINE 10 MG PO TABS: 5.0000 mg | ORAL_TABLET | Freq: Two times a day (BID) | ORAL | Status: DC

## 2015-08-10 NOTE — Telephone Encounter (Signed)
Patient notified as instructed by telephone and verbalized understanding. Patient will schedule appointment when she comes in to pick the script up. Reminder note written on envelope that patient is picking up.

## 2015-08-10 NOTE — Telephone Encounter (Signed)
Printed.  Thanks.   CPE when possible early in 2017.

## 2015-08-11 ENCOUNTER — Telehealth: Payer: Self-pay | Admitting: Family Medicine

## 2015-08-11 NOTE — Telephone Encounter (Signed)
Patient scheduled cpx on 1/11/7.  Patient has her lab work done at [facility]  Patient is requesting lab order and for the lab order to be mailed to her when complete.

## 2015-08-15 NOTE — Telephone Encounter (Signed)
Lab order mailed to patient as instructed. 

## 2015-08-15 NOTE — Telephone Encounter (Signed)
Order done for glucose and lipid panel, dx DM2 screening and lipid screening.  Please mail to patient.  Thanks.

## 2015-09-16 ENCOUNTER — Ambulatory Visit: Payer: 59 | Admitting: Physician Assistant

## 2015-10-12 ENCOUNTER — Other Ambulatory Visit: Payer: Self-pay | Admitting: Family Medicine

## 2015-10-12 ENCOUNTER — Encounter: Payer: Self-pay | Admitting: Family Medicine

## 2015-10-12 ENCOUNTER — Ambulatory Visit (INDEPENDENT_AMBULATORY_CARE_PROVIDER_SITE_OTHER): Payer: 59 | Admitting: Family Medicine

## 2015-10-12 ENCOUNTER — Encounter: Payer: 59 | Admitting: *Deleted

## 2015-10-12 VITALS — BP 112/78 | HR 64 | Temp 98.6°F | Ht 65.0 in | Wt 197.5 lb

## 2015-10-12 DIAGNOSIS — R21 Rash and other nonspecific skin eruption: Secondary | ICD-10-CM

## 2015-10-12 DIAGNOSIS — Z0000 Encounter for general adult medical examination without abnormal findings: Secondary | ICD-10-CM | POA: Diagnosis not present

## 2015-10-12 DIAGNOSIS — Z7189 Other specified counseling: Secondary | ICD-10-CM

## 2015-10-12 DIAGNOSIS — F988 Other specified behavioral and emotional disorders with onset usually occurring in childhood and adolescence: Secondary | ICD-10-CM

## 2015-10-12 DIAGNOSIS — G8929 Other chronic pain: Secondary | ICD-10-CM

## 2015-10-12 DIAGNOSIS — M549 Dorsalgia, unspecified: Secondary | ICD-10-CM

## 2015-10-12 MED ORDER — AMPHETAMINE-DEXTROAMPHETAMINE 10 MG PO TABS: 5.0000 mg | ORAL_TABLET | Freq: Two times a day (BID) | ORAL | Status: DC

## 2015-10-12 MED ORDER — METRONIDAZOLE 1 % EX GEL: Freq: Every day | CUTANEOUS | Status: DC

## 2015-10-12 NOTE — Progress Notes (Signed)
Pre visit review using our clinic review tool, if applicable. No additional management support is needed unless otherwise documented below in the visit note.  CPE- See plan.  Routine anticipatory guidance given to patient.  See health maintenance. Pap mammogram and DXA not due Colon cancer screening not due tdap 2015 Flu 2016 at work PNA and shingles not due Living will d/w pt- she would have her mother designated if patient were incapacitated.  Diet and exercise d/w pt "not so great" on either per patient, encouraged. She has been stress eating.  Upheaval in the last few years with divorce and custody issues.  She had been off prozac recently and had noted less motivation, possibly dec in motivation and being off med are related, d/w pt.  Still okay for outpatient f/u, d/w pt about options.  She agreed to retry prozac.  Fatigue noted, unclear if from mood changes/upheaval noted above.   HIV screen neg ~2002 per patient.  Due for routine labs (cbc/tsh- fatigue, lipid- lipid screening, glucose-DM2 screening). Labs slip given to patient.    ADD meds are currently helpful, w/o ADD.  Needed refill postdated for fill later on.  Done, given to patient.    Needed refill on metrogel, sent.    PMH and SH reviewed  Meds, vitals, and allergies reviewed.   ROS: See HPI.  Otherwise negative.    GEN: nad, alert and oriented, tearful but regains composure.   HEENT: mucous membranes moist NECK: supple w/o LA CV: rrr. PULM: ctab, no inc wob ABD: soft, +bs EXT: no edema

## 2015-10-13 ENCOUNTER — Encounter: Payer: Self-pay | Admitting: Family Medicine

## 2015-10-13 DIAGNOSIS — Z7189 Other specified counseling: Secondary | ICD-10-CM | POA: Insufficient documentation

## 2015-10-13 DIAGNOSIS — G8929 Other chronic pain: Secondary | ICD-10-CM | POA: Insufficient documentation

## 2015-10-13 DIAGNOSIS — M549 Dorsalgia, unspecified: Secondary | ICD-10-CM

## 2015-10-13 NOTE — Assessment & Plan Note (Signed)
Continue as is. No ADE on med.  Noted improvement in sx with med.  

## 2015-10-13 NOTE — Assessment & Plan Note (Signed)
Continue as is. No ADE on med.  Noted improvement in sx with med.

## 2015-10-13 NOTE — Assessment & Plan Note (Signed)
Pap mammogram and DXA not due Colon cancer screening not due tdap 2015 Flu 2016 at work PNA and shingles not due Living will d/w pt- she would have her mother designated if patient were incapacitated.  Diet and exercise d/w pt "not so great" on either per patient, encouraged. She has been stress eating.  Upheaval in the last few years with divorce and custody issues.  She had been off prozac recently and had noted less motivation, possibly dec in motivation and being off med are related, d/w pt.  Still okay for outpatient f/u, d/w pt about options.  She agreed to retry prozac.  Fatigue noted, unclear if from mood changes/upheaval noted above.   HIV screen neg ~2002 per patient.  Due for routine labs (cbc/tsh- fatigue, lipid- lipid screening, glucose-DM2 screening). Labs slip given to patient.

## 2015-12-08 ENCOUNTER — Other Ambulatory Visit: Payer: Self-pay | Admitting: Family Medicine

## 2015-12-08 NOTE — Telephone Encounter (Signed)
Electronic refill request. Last Filled:    90 capsule 3 09/22/2014  CPE: 10/12/15   Please advise.

## 2015-12-09 NOTE — Telephone Encounter (Signed)
Sent. Thanks.   

## 2015-12-16 DIAGNOSIS — G894 Chronic pain syndrome: Secondary | ICD-10-CM | POA: Diagnosis not present

## 2015-12-16 DIAGNOSIS — M4317 Spondylolisthesis, lumbosacral region: Secondary | ICD-10-CM | POA: Diagnosis not present

## 2015-12-16 DIAGNOSIS — Z79891 Long term (current) use of opiate analgesic: Secondary | ICD-10-CM | POA: Diagnosis not present

## 2015-12-16 DIAGNOSIS — M5136 Other intervertebral disc degeneration, lumbar region: Secondary | ICD-10-CM | POA: Diagnosis not present

## 2016-01-03 ENCOUNTER — Telehealth: Payer: Self-pay | Admitting: Family Medicine

## 2016-01-03 NOTE — Telephone Encounter (Signed)
Left message on patient's voicemail to return call

## 2016-01-03 NOTE — Telephone Encounter (Signed)
PLEASE NOTE: All timestamps contained within this report are represented as [place] Standard Time. CONFIDENTIALTY NOTICE: This fax transmission is intended only for the addressee. It contains information that is legally privileged, confidential or otherwise protected from use or disclosure. If you are not the intended recipient, you are strictly prohibited from reviewing, disclosing, copying using or disseminating any of this information or taking any action in reliance on or regarding this information. If you have received this fax in error, please notify us immediately by telephone so that we can arrange for its return to us. Phone: [phone], Toll-Free: [phone], Fax: [phone] Page: 1 of 1 Call Id: [id number] [facility] Client TELEPHONE ADVICE RECORD [facility] Patient Name: Natasha Welch DOB: 02/26/1981 Initial Comment Caller states she's having swelling in her legs and feet. Nurse Assessment Nurse: Elijah Birkaldwell, RN, Lynda Date/Time (Eastern Time): 01/03/2016 9:05:59 AM Confirm and document reason for call. If symptomatic, describe symptoms. You must click the next button to save text entered. ---Caller states she's having swelling in her legs and feet. The swelling started on the left side, from foot up to the knee. Recently had a tattoo applied on the right foot, does not look infected. The swelling on the left started off & on 2 weeks ago. Has the patient traveled out of the country within the last 30 days? ---Not Applicable Does the patient have any new or worsening symptoms? ---Yes Will a triage be completed? ---Yes Related visit to physician within the last 2 weeks? ---Yes Does the PT have any chronic conditions? (i.e. diabetes, asthma, etc.) ---Yes List chronic conditions. ---back pain Is the patient pregnant or possibly pregnant? (Ask all females between the ages of 6212-55) ---No Is this a behavioral health or  substance abuse call? ---No Guidelines Guideline Title Affirmed Question Affirmed Notes Leg Swelling and Edema [1] Thigh, calf, or ankle swelling AND [2] bilateral AND [3] 1 side is more swollen Final Disposition User See Physician within 4 Hours (or PCP triage) Elijah Birkaldwell, RN, Lynda Comments Caller only wants to see Dr. Para Marchuncan. No appointments available today or tomorrow with her provider. If there are any cancellations, please contact caller. She now states the swelling is worse on the right side, especially in the foot. She has been elevating her legs and using ice. Saw her orthopedic Dr. who was going to send a note to Dr. Para Marchuncan regarding the swelling. 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 Referrals REFERRED TO PCP OFFICE Disagree/Comply: Comply

## 2016-01-03 NOTE — Telephone Encounter (Signed)
Yes, thanks

## 2016-01-03 NOTE — Telephone Encounter (Signed)
See what information you can get.   Has this been going on for weeks overall? Any CP, SOB? What is her weight and BP now?  Any sig changes? If not acutely worse, then can she get added on tomorrow?  Or today at 3? Thanks.

## 2016-01-03 NOTE — Telephone Encounter (Signed)
Left leg has been swelling for about a week, right leg started swelling Saturday.  No CP or SOB.  Weight is stable and BP this morning was 92/62.  No other significant changes.  Patient cannot come in tomorrow unless it is a late appointment which is not available.  I opened up a 2:45 on Thursday.  Is that okay?

## 2016-01-05 ENCOUNTER — Encounter: Payer: Self-pay | Admitting: Family Medicine

## 2016-01-05 ENCOUNTER — Ambulatory Visit (INDEPENDENT_AMBULATORY_CARE_PROVIDER_SITE_OTHER): Payer: 59 | Admitting: Family Medicine

## 2016-01-05 VITALS — BP 102/70 | HR 87 | Temp 98.9°F | Wt 195.8 lb

## 2016-01-05 DIAGNOSIS — R609 Edema, unspecified: Secondary | ICD-10-CM | POA: Diagnosis not present

## 2016-01-05 NOTE — Patient Instructions (Signed)
Go to the lab on the way out.  We'll contact you with your lab report. Try otc 10-20mg  Hg compression stockings and update me as needed.  Take care.  Glad to see you.

## 2016-01-05 NOTE — Progress Notes (Signed)
Pre visit review using our clinic review tool, if applicable. No additional management support is needed unless otherwise documented below in the visit note.  She had some L lower leg swelling, up to the knee.  She has seen the ortho clinic in the meantime, was advised to get f/u here.  It would wax and wane but never went all the way back to normal.  It has been going on for about 4 weeks.  Then started having R leg swelling, that was after getting a R foot tattoo on 12/30/15.  R leg swelling is better now.    Less swelling with elevation.  Tender to the touch when she is upright.  R leg is now a little worse than the L, but swelling can happen on both sides.  No FCNAVD.  She doesn't feel sick o/w.  No CP.  Not SOB.  Weigh is down 2 lbs. Some fatigue noted recently.  No h/o DVT personally or in the family.  No more salt in the diet recently.    Meds, vitals, and allergies reviewed.   ROS: See HPI.  Otherwise, noncontributory.  GEN: nad, alert and oriented HEENT: mucous membranes moist NECK: supple w/o LA CV: rrr.  PULM: ctab, no inc wob ABD: soft, +bs EXT: no pitting edema in BLE.  B 42.5cm calf circumference SKIN: no acute rash Normal DP pulses B

## 2016-01-06 DIAGNOSIS — R609 Edema, unspecified: Secondary | ICD-10-CM | POA: Insufficient documentation

## 2016-01-06 LAB — COMPREHENSIVE METABOLIC PANEL
ALBUMIN: 4.2 g/dL (ref 3.5–5.2)
ALT: 10 U/L (ref 0–35)
AST: 14 U/L (ref 0–37)
Alkaline Phosphatase: 52 U/L (ref 39–117)
BUN: 9 mg/dL (ref 6–23)
CHLORIDE: 100 meq/L (ref 96–112)
CO2: 31 meq/L (ref 19–32)
CREATININE: 0.89 mg/dL (ref 0.40–1.20)
Calcium: 9.3 mg/dL (ref 8.4–10.5)
GFR: 76.09 mL/min (ref >60.00)
GLUCOSE: 82 mg/dL (ref 70–99)
Potassium: 4.5 mEq/L (ref 3.5–5.1)
SODIUM: 136 meq/L (ref 135–145)
Total Bilirubin: 0.3 mg/dL (ref 0.2–1.2)
Total Protein: 7.2 g/dL (ref 6.0–8.3)

## 2016-01-06 NOTE — Assessment & Plan Note (Signed)
Nonpitting, bilateral, nontoxic, ctab.  Okay for outpatient f/u.  No sign of ominous dx, ie not at all likely to be B DVT.  Calf not ttp, no bands in the legs.  Likely reasonable to try compression stockings.  Check cmet in the meantime.  She agrees.  See AVS.

## 2016-02-07 ENCOUNTER — Other Ambulatory Visit: Payer: Self-pay

## 2016-02-07 MED ORDER — AMPHETAMINE-DEXTROAMPHETAMINE 10 MG PO TABS: 5.0000 mg | ORAL_TABLET | Freq: Two times a day (BID) | ORAL | Status: DC

## 2016-02-07 NOTE — Telephone Encounter (Signed)
Printed.  Thanks.  

## 2016-02-07 NOTE — Telephone Encounter (Signed)
Patient advised.  Rx left at front desk for pick up. 

## 2016-02-07 NOTE — Telephone Encounter (Signed)
Pt left v/m requesting rx for Adderall. Call when ready for pick up. Last printed # 180 on 10/12/15. Last annual exam on 10/12/15.

## 2016-04-09 ENCOUNTER — Other Ambulatory Visit: Payer: Self-pay | Admitting: Family Medicine

## 2016-04-09 NOTE — Telephone Encounter (Signed)
Electronic refill request. Last Filled:    30 tablet 2 06/15/2015  Last office visit:   01/05/16  Please advise.

## 2016-04-10 NOTE — Telephone Encounter (Signed)
Please call in.  Thanks.   

## 2016-04-10 NOTE — Telephone Encounter (Signed)
Medication phoned to pharmacy.  

## 2016-05-21 ENCOUNTER — Other Ambulatory Visit: Payer: Self-pay

## 2016-05-21 MED ORDER — AMPHETAMINE-DEXTROAMPHETAMINE 10 MG PO TABS: 5.0000 mg | ORAL_TABLET | Freq: Two times a day (BID) | ORAL | 0 refills | Status: DC

## 2016-05-21 NOTE — Telephone Encounter (Signed)
Pt left v/m requesting rx for Adderall. Call when ready for pick up. Last printed # 180 on 02/07/16; last annual exam on 10/12/15.

## 2016-05-21 NOTE — Telephone Encounter (Signed)
Printed.  Thanks.  

## 2016-05-22 MED FILL — DEXTROAMP-AMP 10 MG TAB: 10 | 90 days supply | Qty: 180 | Fill #0

## 2016-05-22 NOTE — Telephone Encounter (Signed)
Left detailed message on voicemail. Rx left at front desk for pick up.  

## 2016-06-01 DIAGNOSIS — Z79891 Long term (current) use of opiate analgesic: Secondary | ICD-10-CM | POA: Diagnosis not present

## 2016-06-01 DIAGNOSIS — M5136 Other intervertebral disc degeneration, lumbar region: Secondary | ICD-10-CM | POA: Diagnosis not present

## 2016-06-01 DIAGNOSIS — G894 Chronic pain syndrome: Secondary | ICD-10-CM | POA: Diagnosis not present

## 2016-06-01 DIAGNOSIS — M4317 Spondylolisthesis, lumbosacral region: Secondary | ICD-10-CM | POA: Diagnosis not present

## 2016-07-22 ENCOUNTER — Telehealth: Payer: 59 | Admitting: Physician Assistant

## 2016-07-22 DIAGNOSIS — B9689 Other specified bacterial agents as the cause of diseases classified elsewhere: Secondary | ICD-10-CM

## 2016-07-22 DIAGNOSIS — J019 Acute sinusitis, unspecified: Secondary | ICD-10-CM | POA: Diagnosis not present

## 2016-07-22 MED ORDER — DOXYCYCLINE HYCLATE 100 MG PO CAPS: 100.0000 mg | ORAL_CAPSULE | Freq: Two times a day (BID) | ORAL | 0 refills | Status: DC

## 2016-07-22 NOTE — Progress Notes (Signed)

## 2016-07-27 ENCOUNTER — Telehealth: Payer: 59 | Admitting: Family

## 2016-07-27 DIAGNOSIS — B9689 Other specified bacterial agents as the cause of diseases classified elsewhere: Secondary | ICD-10-CM

## 2016-07-27 DIAGNOSIS — J329 Chronic sinusitis, unspecified: Secondary | ICD-10-CM | POA: Diagnosis not present

## 2016-07-27 MED ORDER — AMOXICILLIN-POT CLAVULANATE 875-125 MG PO TABS: 1.0000 | ORAL_TABLET | Freq: Two times a day (BID) | ORAL | 0 refills | Status: AC

## 2016-07-27 NOTE — Progress Notes (Signed)

## 2016-08-04 ENCOUNTER — Ambulatory Visit (INDEPENDENT_AMBULATORY_CARE_PROVIDER_SITE_OTHER): Payer: 59 | Admitting: Family Medicine

## 2016-08-04 ENCOUNTER — Encounter: Payer: Self-pay | Admitting: Family Medicine

## 2016-08-04 DIAGNOSIS — R059 Cough, unspecified: Secondary | ICD-10-CM | POA: Insufficient documentation

## 2016-08-04 DIAGNOSIS — R05 Cough: Secondary | ICD-10-CM | POA: Diagnosis not present

## 2016-08-04 MED ORDER — ALBUTEROL SULFATE HFA 108 (90 BASE) MCG/ACT IN AERS: 2.0000 | INHALATION_SPRAY | Freq: Four times a day (QID) | RESPIRATORY_TRACT | 1 refills | Status: DC | PRN

## 2016-08-04 NOTE — Progress Notes (Signed)
Has been sick for 2 weeks, started augmentin 1 week ago.  Was out of work recently.  Still fatigued, still coughing.  No fevers.  No vomiting.  Rare sputum. Sleep disrupted from cough.  Chest sore from coughing.  Some ear pain, off and on.  ST resolved now.  Sinus drainage noted, post nasal gtt.  Some wheeze.  Hasn't had SABA to use.  Tessalon didn't help.    Meds, vitals, and allergies reviewed.   ROS: Per HPI unless specifically indicated in ROS section   GEN: nad, alert and oriented HEENT: mucous membranes moist, tm w/o erythema, nasal exam w/o erythema, clear discharge noted,  OP with cobblestoning, sinuses not ttp NECK: supple w/o LA CV: rrr.   PULM: ctab, no inc wob, cough noted.  EXT: no edema

## 2016-08-04 NOTE — Assessment & Plan Note (Signed)
Add on SABA.  Okay for outpatient f/u.  We can broaden abx to cover atypicals if not better soon.  Supportive care o/w.  She agrees. Nontoxic.

## 2016-08-04 NOTE — Patient Instructions (Signed)
Add on albuterol and update me if not better.  Take care.  Glad to see you.

## 2016-08-04 NOTE — Progress Notes (Signed)
Pre visit review using our clinic review tool, if applicable. No additional management support is needed unless otherwise documented below in the visit note. 

## 2016-08-06 ENCOUNTER — Other Ambulatory Visit: Payer: Self-pay | Admitting: Family Medicine

## 2016-08-06 NOTE — Telephone Encounter (Signed)
MyChart refill request.  Last Filled:    180 tablet 0 05/21/2016  CPE:  10/12/15  Please advise.

## 2016-08-07 MED ORDER — AMPHETAMINE-DEXTROAMPHETAMINE 10 MG PO TABS: 5.0000 mg | ORAL_TABLET | Freq: Two times a day (BID) | ORAL | 0 refills | Status: DC

## 2016-08-07 NOTE — Telephone Encounter (Signed)
Printed.  Thanks.  

## 2016-08-07 NOTE — Telephone Encounter (Signed)
Patient advised.  Rx left at front desk for pick up. 

## 2016-08-08 ENCOUNTER — Other Ambulatory Visit: Payer: Self-pay | Admitting: Family Medicine

## 2016-08-08 ENCOUNTER — Encounter: Payer: Self-pay | Admitting: Family Medicine

## 2016-08-08 MED ORDER — AMPHETAMINE-DEXTROAMPHETAMINE 10 MG PO TABS: 15.0000 mg | ORAL_TABLET | Freq: Two times a day (BID) | ORAL | Status: DC

## 2016-08-09 ENCOUNTER — Encounter: Payer: Self-pay | Admitting: Family Medicine

## 2016-08-10 ENCOUNTER — Telehealth: Payer: Self-pay | Admitting: Family Medicine

## 2016-08-10 NOTE — Telephone Encounter (Signed)
They can't fill that Rx you gave her because it still has the old directions. If you give her a new Rx with the new directions then they can fill the new Rx early but not the Rx they have now

## 2016-08-10 NOTE — Telephone Encounter (Signed)
See mychart message.  Please call pharmacy.  She has a dos/sig  change on adderall.  She'll run out early.  Can they fill the next rx early or does she need to pick up a new hard copy?  Let me/patient know.  Thanks.

## 2016-08-11 MED ORDER — AMPHETAMINE-DEXTROAMPHETAMINE 10 MG PO TABS: 15.0000 mg | ORAL_TABLET | Freq: Two times a day (BID) | ORAL | 0 refills | Status: DC

## 2016-08-11 NOTE — Telephone Encounter (Signed)
Notify pt.  New rx printed.  Thanks.

## 2016-08-13 NOTE — Telephone Encounter (Signed)
MyChart message sent to pt that rx is up front

## 2016-08-16 DIAGNOSIS — H52223 Regular astigmatism, bilateral: Secondary | ICD-10-CM | POA: Diagnosis not present

## 2016-08-16 DIAGNOSIS — H5213 Myopia, bilateral: Secondary | ICD-10-CM | POA: Diagnosis not present

## 2016-09-12 ENCOUNTER — Other Ambulatory Visit: Payer: Self-pay | Admitting: Family Medicine

## 2016-09-12 NOTE — Telephone Encounter (Signed)
Received refill request electronically Last refill 08/11/16 #90 Last office visit 08/04/16

## 2016-09-13 MED ORDER — AMPHETAMINE-DEXTROAMPHETAMINE 10 MG PO TABS: 15.0000 mg | ORAL_TABLET | Freq: Two times a day (BID) | ORAL | 0 refills | Status: DC

## 2016-09-13 NOTE — Telephone Encounter (Signed)
Patient advised.  Rx left at front desk for pick up. 

## 2016-09-13 NOTE — Telephone Encounter (Signed)
Printed.  Thanks.  

## 2016-10-23 ENCOUNTER — Other Ambulatory Visit: Payer: Self-pay | Admitting: Family Medicine

## 2016-10-24 MED ORDER — AMPHETAMINE-DEXTROAMPHETAMINE 10 MG PO TABS: 15.0000 mg | ORAL_TABLET | Freq: Two times a day (BID) | ORAL | 0 refills | Status: DC

## 2016-10-24 NOTE — Telephone Encounter (Signed)
Patient picked script up per Larita FifeLynn.

## 2016-10-24 NOTE — Telephone Encounter (Signed)
Printed x3 months.

## 2016-10-25 ENCOUNTER — Other Ambulatory Visit: Payer: Self-pay | Admitting: Family Medicine

## 2016-10-25 NOTE — Telephone Encounter (Signed)
Pt had a recent acute appt but no recent f/u or CPE, last filled on 04/10/16 #30 tabs with 2 additional refills, please advise

## 2016-10-26 ENCOUNTER — Other Ambulatory Visit: Payer: Self-pay | Admitting: Family Medicine

## 2016-10-26 NOTE — Telephone Encounter (Signed)
Rx called in to pharmacy. 

## 2016-10-26 NOTE — Telephone Encounter (Signed)
Last filled 09-04-16 #30 Last OV 01-05-16 No Future OV

## 2016-10-26 NOTE — Telephone Encounter (Signed)
Please call in.  Thanks.   

## 2016-10-28 NOTE — Telephone Encounter (Signed)
Please call in.  Thanks.   

## 2016-10-29 NOTE — Telephone Encounter (Signed)
Rx called to pharmacy as instructed. 

## 2017-01-23 ENCOUNTER — Other Ambulatory Visit: Payer: Self-pay

## 2017-01-23 MED ORDER — AMPHETAMINE-DEXTROAMPHETAMINE 10 MG PO TABS: 15.0000 mg | ORAL_TABLET | Freq: Two times a day (BID) | ORAL | 0 refills | Status: DC

## 2017-01-23 NOTE — Telephone Encounter (Signed)
Pt left v/m requesting rx for Adderall. Call when ready for pick up. Last printed # 90 on 10/24/16 x 2. Last seen for annual 10/12/15. Pt will cb to schedule CPX before these rx are out.

## 2017-01-23 NOTE — Telephone Encounter (Signed)
Printed.  Thanks.  

## 2017-01-24 NOTE — Telephone Encounter (Signed)
Patient advised.  Rx left at front desk for pick up. 

## 2017-03-13 ENCOUNTER — Telehealth: Payer: Self-pay | Admitting: Family Medicine

## 2017-03-13 ENCOUNTER — Other Ambulatory Visit: Payer: Self-pay | Admitting: Family Medicine

## 2017-03-13 NOTE — Telephone Encounter (Signed)
Received refill electronically Last refill 10/28/16 #30/1 Last office visit 08/04/16

## 2017-03-13 NOTE — Telephone Encounter (Signed)
Patient scheduled a physical with Dr.Duncan on 04/10/17.  Patient would like the lab order sent to [facility], so she can have the labs done before her appointment.  She uses the [facility] on [address]  Please call patient when labs are put in. Patient said a detailed message can be left on her cell phone.

## 2017-03-14 NOTE — Telephone Encounter (Signed)
Medication phoned to pharmacy.  

## 2017-03-14 NOTE — Telephone Encounter (Signed)
Please call in.  Thanks.   

## 2017-03-17 NOTE — Telephone Encounter (Signed)
Order done for cmet- dx G43.009.  Ready for pick up.  Thanks.

## 2017-03-18 NOTE — Telephone Encounter (Addendum)
Left detailed message on voicemail for pickup or provide fax number to [facility] on [address].  Patient states she will pick up the order tomorrow (Wed) after she gets off work since we have extended hours.

## 2017-03-28 DIAGNOSIS — M4317 Spondylolisthesis, lumbosacral region: Secondary | ICD-10-CM | POA: Diagnosis not present

## 2017-04-10 ENCOUNTER — Ambulatory Visit (INDEPENDENT_AMBULATORY_CARE_PROVIDER_SITE_OTHER): Payer: 59 | Admitting: Family Medicine

## 2017-04-10 ENCOUNTER — Encounter: Payer: Self-pay | Admitting: Family Medicine

## 2017-04-10 VITALS — BP 100/60 | HR 64 | Temp 98.7°F | Ht 65.0 in | Wt 200.2 lb

## 2017-04-10 DIAGNOSIS — F988 Other specified behavioral and emotional disorders with onset usually occurring in childhood and adolescence: Secondary | ICD-10-CM

## 2017-04-10 DIAGNOSIS — M549 Dorsalgia, unspecified: Secondary | ICD-10-CM

## 2017-04-10 DIAGNOSIS — R21 Rash and other nonspecific skin eruption: Secondary | ICD-10-CM

## 2017-04-10 DIAGNOSIS — R5383 Other fatigue: Secondary | ICD-10-CM | POA: Insufficient documentation

## 2017-04-10 DIAGNOSIS — Z0000 Encounter for general adult medical examination without abnormal findings: Secondary | ICD-10-CM | POA: Diagnosis not present

## 2017-04-10 DIAGNOSIS — G8929 Other chronic pain: Secondary | ICD-10-CM

## 2017-04-10 DIAGNOSIS — F4320 Adjustment disorder, unspecified: Secondary | ICD-10-CM

## 2017-04-10 MED ORDER — VENLAFAXINE HCL 37.5 MG PO TABS: 37.5000 mg | ORAL_TABLET | Freq: Two times a day (BID) | ORAL | 3 refills | Status: DC

## 2017-04-10 NOTE — Patient Instructions (Signed)
Take care.  Glad to see you.  Update me as needed.  Labs when possible.  Try effexor.  Start with 1 a day then gradually go up to twice a day.

## 2017-04-10 NOTE — Progress Notes (Signed)
CPE- See plan.  Routine anticipatory guidance given to patient.  See health maintenance.  The possibility exists that previously documented standard health maintenance information may have been brought forward from a previous encounter into this note.  If needed, that same information has been updated to reflect the current situation based on today's encounter.    Pap mammogram and DXA not due Colon cancer screening not due tdap 2015 Flu done at work yearly PNA and shingles not due Living will d/w pt- she would have her mother designated if patient were incapacitated.  Diet and exercise d/w pt, "not so great" on either per patient, encouraged. She has been stress eating.  Upheaval in the last few years with divorce and custody issues. she is trying to work on both diet and exercise.   HIV screen neg ~2002 per patient.   Facial rash better with metrogel, needed rx less in the summer.    Pain meds per Dr. Ethelene Halamos.  I'll defer, d/w pt.    Fatigue.  She has ongoing stressors and she is trying to work though all of that.  Her boyfriend hsa colon cancer and is going through chemo after surgery  D/w pt.   That may contribute.    Mood had been variable.  She had been taking SSRI episodically w/o a lot of benefit.  Has been off for about 2.5 months.  She can "get by" but would prefer to get some help.  No SI/HI. Xanax helps with anxiety, but only with a whole tab.  Used prn.    Adderall helps with concentration but still with fatigue even with adderall use.  She can clearly tell when she skips a dose, ie vacation.    PMH and SH reviewed  Meds, vitals, and allergies reviewed.   ROS: Per HPI.  Unless specifically indicated otherwise in HPI, the patient denies:  General: fever. Eyes: acute vision changes ENT: sore throat Cardiovascular: chest pain Respiratory: SOB GI: vomiting GU: dysuria Musculoskeletal: acute back pain Derm: acute rash Neuro: acute motor dysfunction Psych: worsening  mood Endocrine: polydipsia Heme: bleeding Allergy: hayfever  GEN: nad, alert and oriented HEENT: mucous membranes moist NECK: supple w/o LA CV: rrr. PULM: ctab, no inc wob ABD: soft, +bs EXT: no edema SKIN: no acute rash

## 2017-04-11 NOTE — Assessment & Plan Note (Signed)
Multiple stressors noted that likely contribute. Lab orders done for TSH, CBC, CMET.  Update me as needed.

## 2017-04-11 NOTE — Assessment & Plan Note (Signed)
Improved with Adderall, no adverse effect on medication. Continue as is. She agrees.

## 2017-04-11 NOTE — Assessment & Plan Note (Signed)
Significant stressors noted. SSRI was not as helpful. Discussed with her about the flexor start. Start with 37.5 mg once a day for a few days and then try to go up to twice a day. Update me as needed. She agrees. No suicidal or homicidal intent. Okay for outpatient follow-up.

## 2017-04-11 NOTE — Assessment & Plan Note (Signed)
Per outside clinic. I'll defer.

## 2017-04-11 NOTE — Assessment & Plan Note (Signed)
Improved with as needed MetroGel use. Continue as is

## 2017-04-11 NOTE — Assessment & Plan Note (Signed)
Pap mammogram and DXA not due Colon cancer screening not due tdap 2015 Flu done at work yearly PNA and shingles not due Living will d/w pt- she would have her mother designated if patient were incapacitated.  Diet and exercise d/w pt, "not so great" on either per patient, encouraged. She has been stress eating.  Upheaval in the last few years with divorce and custody issues. she is trying to work on both diet and exercise.   HIV screen neg ~2002 per patient.

## 2017-04-12 ENCOUNTER — Other Ambulatory Visit: Payer: Self-pay | Admitting: Family Medicine

## 2017-04-12 DIAGNOSIS — R5383 Other fatigue: Secondary | ICD-10-CM | POA: Diagnosis not present

## 2017-04-13 LAB — CBC WITH DIFFERENTIAL/PLATELET
BASOS ABS: 0 10*3/uL (ref 0.0–0.2)
BASOS: 0 %
EOS (ABSOLUTE): 0.6 10*3/uL — AB (ref 0.0–0.4)
Eos: 8 %
HEMOGLOBIN: 14.8 g/dL (ref 11.1–15.9)
Hematocrit: 42.5 % (ref 34.0–46.6)
IMMATURE GRANS (ABS): 0 10*3/uL (ref 0.0–0.1)
Immature Granulocytes: 0 %
LYMPHS ABS: 2.2 10*3/uL (ref 0.7–3.1)
LYMPHS: 32 %
MCH: 29 pg (ref 26.6–33.0)
MCHC: 34.8 g/dL (ref 31.5–35.7)
MCV: 83 fL (ref 79–97)
Monocytes Absolute: 0.7 10*3/uL (ref 0.1–0.9)
Monocytes: 10 %
NEUTROS ABS: 3.4 10*3/uL (ref 1.4–7.0)
Neutrophils: 50 %
PLATELETS: 236 10*3/uL (ref 150–379)
RBC: 5.11 x10E6/uL (ref 3.77–5.28)
RDW: 13.4 % (ref 12.3–15.4)
WBC: 6.8 10*3/uL (ref 3.4–10.8)

## 2017-04-13 LAB — COMPREHENSIVE METABOLIC PANEL
A/G RATIO: 1.9 (ref 1.2–2.2)
ALBUMIN: 4.4 g/dL (ref 3.5–5.5)
ALK PHOS: 42 IU/L (ref 39–117)
ALT: 14 IU/L (ref 0–32)
AST: 18 IU/L (ref 0–40)
BILIRUBIN TOTAL: 0.7 mg/dL (ref 0.0–1.2)
BUN / CREAT RATIO: 15 (ref 9–23)
BUN: 13 mg/dL (ref 6–20)
CHLORIDE: 100 mmol/L (ref 96–106)
CO2: 23 mmol/L (ref 20–29)
Calcium: 9.3 mg/dL (ref 8.7–10.2)
Creatinine, Ser: 0.88 mg/dL (ref 0.57–1.00)
GFR calc non Af Amer: 84 mL/min/{1.73_m2} (ref >59)
GFR, EST AFRICAN AMERICAN: 97 mL/min/{1.73_m2} (ref >59)
Globulin, Total: 2.3 g/dL (ref 1.5–4.5)
Glucose: 92 mg/dL (ref 65–99)
POTASSIUM: 4.8 mmol/L (ref 3.5–5.2)
SODIUM: 138 mmol/L (ref 134–144)
TOTAL PROTEIN: 6.7 g/dL (ref 6.0–8.5)

## 2017-04-13 LAB — TSH: TSH: 1.45 u[IU]/mL (ref 0.450–4.500)

## 2017-05-02 ENCOUNTER — Encounter: Payer: Self-pay | Admitting: Family Medicine

## 2017-05-03 ENCOUNTER — Other Ambulatory Visit: Payer: Self-pay | Admitting: Family Medicine

## 2017-05-03 ENCOUNTER — Encounter: Payer: Self-pay | Admitting: *Deleted

## 2017-05-03 MED ORDER — AMPHETAMINE-DEXTROAMPHETAMINE 15 MG PO TABS
15.0000 mg | ORAL_TABLET | Freq: Two times a day (BID) | ORAL | 0 refills | Status: DC
Start: 2017-05-03 — End: 2017-08-13

## 2017-05-03 MED ORDER — AMPHETAMINE-DEXTROAMPHETAMINE 15 MG PO TABS: 15.0000 mg | ORAL_TABLET | Freq: Every day | ORAL | 0 refills | Status: DC

## 2017-05-03 MED ORDER — AMPHETAMINE-DEXTROAMPHETAMINE 15 MG PO TABS
15.0000 mg | ORAL_TABLET | Freq: Every day | ORAL | 0 refills | Status: DC
Start: 2017-05-03 — End: 2017-05-10

## 2017-05-10 ENCOUNTER — Telehealth: Payer: Self-pay

## 2017-05-10 MED ORDER — AMPHETAMINE-DEXTROAMPHETAMINE 15 MG PO TABS: 15.0000 mg | ORAL_TABLET | Freq: Two times a day (BID) | ORAL | 0 refills | Status: DC

## 2017-05-10 MED ORDER — AMPHETAMINE-DEXTROAMPHETAMINE 15 MG PO TABS
15.0000 mg | ORAL_TABLET | Freq: Two times a day (BID) | ORAL | 0 refills | Status: DC
Start: 2017-05-10 — End: 2017-08-13

## 2017-05-10 NOTE — Telephone Encounter (Signed)
Marisue IvanLiz at [facility] said pt brought 3 adderall rxs. The rx for this month # 60 taking one bid. But the 2 other rx for Sept and Oct has directions take one tab po daily with qty # 60. Pt usually gets directions to take bid per pharmacist. If instructions need to be changed please print 2 new rx for pt to pick up. Marisue IvanLiz request cb.

## 2017-05-10 NOTE — Telephone Encounter (Signed)
Natasha Welch advised and patient notified.

## 2017-05-10 NOTE — Telephone Encounter (Signed)
This was an error on my part.  I apologize.   Reprinted the other 2 rxs.  Please have pharmacy destroy the old/incorrect rxs.  Thanks.

## 2017-05-24 ENCOUNTER — Telehealth: Payer: 59 | Admitting: Family

## 2017-05-24 DIAGNOSIS — H109 Unspecified conjunctivitis: Secondary | ICD-10-CM | POA: Diagnosis not present

## 2017-05-24 MED ORDER — POLYMYXIN B-TRIMETHOPRIM 10000-0.1 UNIT/ML-% OP SOLN: 2.0000 [drp] | Freq: Four times a day (QID) | OPHTHALMIC | 0 refills | Status: DC

## 2017-05-24 NOTE — Progress Notes (Signed)
Thank you for the details you put in the comment boxes. Those details really help [place] take better care of you.   We are sorry that you are not feeling well.  Here is how we plan to help!  Based on what you have shared with me it looks like you have conjunctivitis.  Conjunctivitis is a common inflammatory or infectious condition of the eye that is often referred to as "pink eye".  In most cases it is contagious (viral or bacterial). However, not all conjunctivitis requires antibiotics (ex. Allergic).  We have made appropriate suggestions for you based upon your presentation.  I have prescribed Polytrim Ophthalmic drops 2 drops in each eye every 6 hours times 5 days  Pink eye can be highly contagious.  It is typically spread through direct contact with secretions, or contaminated objects or surfaces that one may have touched.  Strict handwashing is suggested with soap and water is urged.  If not available, use alcohol based had sanitizer.  Avoid unnecessary touching of the eye.  If you wear contact lenses, you will need to refrain from wearing them until you see no white discharge from the eye for at least 24 hours after being on medication.  You should see symptom improvement in 1-2 days after starting the medication regimen.  Call us if symptoms are not improved in 1-2 days.  Home Care:  Wash your hands often!  Do not wear your contacts until you complete your treatment plan.  Avoid sharing towels, bed linen, personal items with a person who has pink eye.  See attention for anyone in your home with similar symptoms.  Get Help Right Away If:  Your symptoms do not improve.  You develop blurred or loss of vision.  Your symptoms worsen (increased discharge, pain or redness)  Your e-visit answers were reviewed by a board certified advanced clinical practitioner to complete your personal care plan.  Depending on the condition, your plan could have included both over the counter or prescription  medications.  If there is a problem please reply  once you have received a response from your provider.  Your safety is important to [place].  If you have drug allergies check your prescription carefully.    You can use MyChart to ask questions about today's visit, request a non-urgent call back, or ask for a work or school excuse for 24 hours related to this e-Visit. If it has been greater than 24 hours you will need to follow up with your provider, or enter a new e-Visit to address those concerns.   You will get an e-mail in the next two days asking about your experience.  I hope that your e-visit has been valuable and will speed your recovery. Thank you for using e-visits.

## 2017-06-16 ENCOUNTER — Encounter: Payer: Self-pay | Admitting: Family Medicine

## 2017-06-17 ENCOUNTER — Other Ambulatory Visit: Payer: Self-pay | Admitting: Family Medicine

## 2017-06-17 NOTE — Telephone Encounter (Signed)
Electronic refill request. Xanax Last office visit:   04/10/17 Last Filled:    30 tablet 1 03/14/2017  Please advise.

## 2017-06-17 NOTE — Telephone Encounter (Signed)
Medication phoned to pharmacy.  

## 2017-06-17 NOTE — Telephone Encounter (Deleted)
Medication phoned to pharmacy.  

## 2017-06-18 NOTE — Telephone Encounter (Signed)
Agreed, thanks

## 2017-07-25 DIAGNOSIS — M5136 Other intervertebral disc degeneration, lumbar region: Secondary | ICD-10-CM | POA: Diagnosis not present

## 2017-07-25 DIAGNOSIS — G894 Chronic pain syndrome: Secondary | ICD-10-CM | POA: Diagnosis not present

## 2017-07-31 DIAGNOSIS — M25512 Pain in left shoulder: Secondary | ICD-10-CM | POA: Diagnosis not present

## 2017-07-31 DIAGNOSIS — M25522 Pain in left elbow: Secondary | ICD-10-CM | POA: Diagnosis not present

## 2017-07-31 MED FILL — MORPHINE SULFATE IR 15 MG T: 15 | 30 days supply | Qty: 120 | Fill #0

## 2017-08-09 DIAGNOSIS — M25512 Pain in left shoulder: Secondary | ICD-10-CM | POA: Diagnosis not present

## 2017-08-09 DIAGNOSIS — M25522 Pain in left elbow: Secondary | ICD-10-CM | POA: Diagnosis not present

## 2017-08-11 ENCOUNTER — Other Ambulatory Visit: Payer: Self-pay | Admitting: Family Medicine

## 2017-08-12 NOTE — Telephone Encounter (Signed)
Electronic refill Last refill 05/10/17 #60 Last office visit 04/10/17

## 2017-08-13 ENCOUNTER — Other Ambulatory Visit: Payer: Self-pay | Admitting: Sports Medicine

## 2017-08-13 DIAGNOSIS — M25512 Pain in left shoulder: Secondary | ICD-10-CM

## 2017-08-13 MED ORDER — AMPHETAMINE-DEXTROAMPHETAMINE 15 MG PO TABS: 15.0000 mg | ORAL_TABLET | Freq: Two times a day (BID) | ORAL | 0 refills | Status: DC

## 2017-08-13 NOTE — Telephone Encounter (Signed)
Left detailed message on voicemail. Rx left at front desk for pick up.  

## 2017-08-13 NOTE — Telephone Encounter (Signed)
Printed.  Thanks.  

## 2017-08-17 ENCOUNTER — Ambulatory Visit
Admission: RE | Admit: 2017-08-17 | Discharge: 2017-08-17 | Disposition: A | Discharge status: Home / Self Care | Payer: 59 | Source: Ambulatory Visit | Attending: Sports Medicine | Admitting: Sports Medicine

## 2017-08-17 DIAGNOSIS — M25512 Pain in left shoulder: Secondary | ICD-10-CM | POA: Diagnosis not present

## 2017-08-17 DIAGNOSIS — R937 Abnormal findings on diagnostic imaging of other parts of musculoskeletal system: Secondary | ICD-10-CM | POA: Diagnosis not present

## 2017-08-27 ENCOUNTER — Other Ambulatory Visit: Payer: Self-pay | Admitting: Family Medicine

## 2017-08-27 NOTE — Telephone Encounter (Signed)
Electronic refill request. Alprazolam Last office visit:   04/10/17 Last Filled:    30 tablet 1 06/17/2017  Please advise.

## 2017-08-28 NOTE — Telephone Encounter (Signed)
Please call in.  Thanks.   

## 2017-08-28 NOTE — Telephone Encounter (Signed)
Rx called to pharmacy as instructed. 

## 2017-08-29 DIAGNOSIS — M25512 Pain in left shoulder: Secondary | ICD-10-CM | POA: Diagnosis not present

## 2017-08-29 DIAGNOSIS — M7502 Adhesive capsulitis of left shoulder: Secondary | ICD-10-CM | POA: Diagnosis not present

## 2017-09-17 DIAGNOSIS — M7502 Adhesive capsulitis of left shoulder: Secondary | ICD-10-CM | POA: Diagnosis not present

## 2017-09-17 DIAGNOSIS — M25512 Pain in left shoulder: Secondary | ICD-10-CM | POA: Diagnosis not present

## 2017-09-18 DIAGNOSIS — M7502 Adhesive capsulitis of left shoulder: Secondary | ICD-10-CM | POA: Diagnosis not present

## 2017-09-19 DIAGNOSIS — M25512 Pain in left shoulder: Secondary | ICD-10-CM | POA: Diagnosis not present

## 2017-09-19 DIAGNOSIS — M7502 Adhesive capsulitis of left shoulder: Secondary | ICD-10-CM | POA: Diagnosis not present

## 2017-10-03 ENCOUNTER — Telehealth: Payer: 59 | Admitting: Physician Assistant

## 2017-10-03 DIAGNOSIS — B9689 Other specified bacterial agents as the cause of diseases classified elsewhere: Secondary | ICD-10-CM | POA: Diagnosis not present

## 2017-10-03 DIAGNOSIS — J019 Acute sinusitis, unspecified: Secondary | ICD-10-CM

## 2017-10-03 MED ORDER — AMOXICILLIN-POT CLAVULANATE 875-125 MG PO TABS: 1.0000 | ORAL_TABLET | Freq: Two times a day (BID) | ORAL | 0 refills | Status: DC

## 2017-10-03 MED ORDER — FLUCONAZOLE 150 MG PO TABS: 150.0000 mg | ORAL_TABLET | Freq: Once | ORAL | 0 refills | Status: AC

## 2017-10-03 MED FILL — FLUCONAZOLE 150 MG TABLET: 150 | 1 days supply | Qty: 1 | Fill #0

## 2017-10-03 MED FILL — AMOX TR-K CLV 875-125 MG TA: 875-125 | 7 days supply | Qty: 14 | Fill #0

## 2017-10-03 NOTE — Progress Notes (Signed)
We are sorry that you are not feeling well.  Here is how we plan to help!  Based on what you have shared with me it looks like you have sinusitis.  Sinusitis is inflammation and infection in the sinus cavities of the head.  Based on your presentation I believe you most likely have Acute Bacterial Sinusitis.  This is an infection caused by bacteria and is treated with antibiotics. I have prescribed Augmentin 875mg /125mg  one tablet twice daily with food, for 7 days. You may use an oral decongestant such as Mucinex D or if you have glaucoma or high blood pressure use plain Mucinex. Saline nasal spray help and can safely be used as often as needed for congestion.  If you develop worsening sinus pain, fever or notice severe headache and vision changes, or if symptoms are not better after completion of antibiotic, please schedule an appointment with a health care provider.    I will send in a single Diflucan for you.  Sinus infections are not as easily transmitted as other respiratory infection, however we still recommend that you avoid close contact with loved ones, especially the very young and elderly.  Remember to wash your hands thoroughly throughout the day as this is the number one way to prevent the spread of infection!  Home Care:  Only take medications as instructed by your medical team.  Complete the entire course of an antibiotic.  Do not take these medications with alcohol.  A steam or ultrasonic humidifier can help congestion.  You can place a towel over your head and breathe in the steam from hot water coming from a faucet.  Avoid close contacts especially the very young and the elderly.  Cover your mouth when you cough or sneeze.  Always remember to wash your hands.  Get Help Right Away If:  You develop worsening fever or sinus pain.  You develop a severe head ache or visual changes.  Your symptoms persist after you have completed your treatment plan.  Make sure  you  Understand these instructions.  Will watch your condition.  Will get help right away if you are not doing well or get worse.  Your e-visit answers were reviewed by a board certified advanced clinical practitioner to complete your personal care plan.  Depending on the condition, your plan could have included both over the counter or prescription medications.  If there is a problem please reply  once you have received a response from your provider.  Your safety is important to us.  If you have drug allergies check your prescription carefully.    You can use MyChart to ask questions about today's visit, request a non-urgent call back, or ask for a work or school excuse for 24 hours related to this e-Visit. If it has been greater than 24 hours you will need to follow up with your provider, or enter a new e-Visit to address those concerns.  You will get an e-mail in the next two days asking about your experience.  I hope that your e-visit has been valuable and will speed your recovery. Thank you for using e-visits.

## 2017-11-01 ENCOUNTER — Encounter: Payer: Self-pay | Admitting: Family Medicine

## 2017-11-01 ENCOUNTER — Other Ambulatory Visit: Payer: Self-pay | Admitting: Family Medicine

## 2017-11-01 NOTE — Telephone Encounter (Signed)
Please see MyChart Message. Last office visit:   04/10/2017 Last Filled:    30 tablet 1 08/28/2017  Please advise.

## 2017-11-03 NOTE — Telephone Encounter (Signed)
Please call her and schedule a 30 min OV.  I sent the rx.  Thanks.

## 2017-11-04 NOTE — Telephone Encounter (Signed)
Appt scheduled

## 2017-11-06 DIAGNOSIS — S4982XD Other specified injuries of left shoulder and upper arm, subsequent encounter: Secondary | ICD-10-CM | POA: Diagnosis not present

## 2017-11-13 ENCOUNTER — Encounter: Payer: Self-pay | Admitting: Family Medicine

## 2017-11-13 ENCOUNTER — Ambulatory Visit (INDEPENDENT_AMBULATORY_CARE_PROVIDER_SITE_OTHER): Payer: 59 | Admitting: Family Medicine

## 2017-11-13 DIAGNOSIS — G8929 Other chronic pain: Secondary | ICD-10-CM

## 2017-11-13 DIAGNOSIS — M25519 Pain in unspecified shoulder: Secondary | ICD-10-CM | POA: Insufficient documentation

## 2017-11-13 DIAGNOSIS — M25512 Pain in left shoulder: Secondary | ICD-10-CM | POA: Diagnosis not present

## 2017-11-13 DIAGNOSIS — F4320 Adjustment disorder, unspecified: Secondary | ICD-10-CM

## 2017-11-13 MED ORDER — VENLAFAXINE HCL ER 75 MG PO CP24: 75.0000 mg | ORAL_CAPSULE | Freq: Every day | ORAL | 1 refills | Status: DC

## 2017-11-13 NOTE — Assessment & Plan Note (Signed)
Significant strain with the death of her long-term boyfriend.  She is trying to manage all of that, manage work, and manage her chronic shoulder pain for which she will need surgery.  Discussed with her about options.  No adverse effect on her pain medications.  No sedation from current medications.  She had trouble taking venlafaxine with decrease in appetite recently.  She may do better on a higher dose and with the extended release version of the medication.  Discussed with patient.  She did not have insomnia on the medication previously.  Will start taking venlafaxine 75 mg of the extended release version at night.  Continue Xanax as needed.  No suicidal or homicidal intent.  Still okay for outpatient follow-up.  She agrees with the plan and will update me as needed.  >25 minutes spent in face to face time with patient, >50% spent in counselling or coordination of care.

## 2017-11-13 NOTE — Assessment & Plan Note (Addendum)
Requesting records from orthopedics.  I will defer to orthopedics.  She agrees. See AVS.

## 2017-11-13 NOTE — Patient Instructions (Addendum)
Try the higher dose of venlafaxine at night and please call Dr. Ethelene Halamos about options re: shoulder pain/pain meds.   Take care.  Glad to see you.  Let me know how you are doing in the meantime.
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
## 2017-11-14 ENCOUNTER — Other Ambulatory Visit: Payer: Self-pay | Admitting: Family Medicine

## 2017-11-14 ENCOUNTER — Encounter: Payer: Self-pay | Admitting: Family Medicine

## 2017-11-14 NOTE — Telephone Encounter (Signed)
Last office visit 11/13/2017.  Last refilled 08/13/2017 x 3 months.  Ok to refill?

## 2017-11-15 MED ORDER — AMPHETAMINE-DEXTROAMPHETAMINE 15 MG PO TABS: 15.0000 mg | ORAL_TABLET | Freq: Two times a day (BID) | ORAL | 0 refills | Status: DC

## 2017-11-15 NOTE — Telephone Encounter (Signed)
Sent. Thanks.   

## 2017-11-19 NOTE — H&P (Signed)
Natasha Welch is an 37 y.o. female.    Chief Complaint: left shoulder and pain stiffness  HPI: Pt is a 37 y.o. female complaining of left shoulder pain for multiple years. Pain had continually increased since the beginning. Exam shows very limited rom and stiffness. Pt has tried various conservative treatments which have failed to alleviate their symptoms, including injections and therapy. Various options are discussed with the patient. Risks, benefits and expectations were discussed with the patient. Patient understand the risks, benefits and expectations and wishes to proceed with surgery.   PCP:  Joaquim Namuncan, Graham S, MD  D/C Plans: Home  PMH: Past Medical History:  Diagnosis Date  . ADD (attention deficit disorder)   . Anxiety    adjustment/anxiety related to marriage/divorce 2011  . Endometriosis   . Migraine   . Scoliosis    facet injection 2014    PSH: Past Surgical History:  Procedure Laterality Date  . ABDOMINAL HYSTERECTOMY    . BREAST SURGERY  2012  . CESAREAN SECTION      Social History:  reports that she has quit smoking. she has never used smokeless tobacco. She reports that she drinks alcohol. She reports that she does not use drugs.  Allergies:  Allergies  Allergen Reactions  . Ciprofloxacin Hcl Nausea And Vomiting  . Corticosteroids Other (See Comments)    Intolerant of injection, with inc pain afterward  . Doxycycline Nausea And Vomiting  . Prozac [Fluoxetine Hcl] Other (See Comments)    Lack of effect  . Tetanus Toxoid     REACTION: Local reaction    Medications: No current facility-administered medications for this encounter.    Current Outpatient Medications  Medication Sig Dispense Refill  . albuterol (PROVENTIL HFA;VENTOLIN HFA) 108 (90 Base) MCG/ACT inhaler Inhale 2 puffs into the lungs every 6 (six) hours as needed for wheezing or shortness of breath. 1 Inhaler 1  . ALPRAZolam (XANAX) 0.5 MG tablet TAKE 1/2 TO 1 TABLET BY MOUTH TWO TIMES  DAILY AS NEEDED 30 tablet 1  . amphetamine-dextroamphetamine (ADDERALL) 15 MG tablet Take 1 tablet by mouth 2 (two) times daily. Fill on/after 01/12/2018 60 tablet 0  . amphetamine-dextroamphetamine (ADDERALL) 15 MG tablet Take 1 tablet by mouth 2 (two) times daily. Do Not Fill Until 12/12/2017 60 tablet 0  . amphetamine-dextroamphetamine (ADDERALL) 15 MG tablet Take 1 tablet by mouth 2 (two) times daily. 60 tablet 0  . ibuprofen (ADVIL,MOTRIN) 800 MG tablet Take 1 tablet (800 mg total) by mouth every 8 (eight) hours as needed (for pain). 30 tablet 1  . loratadine (ALAVERT) 10 MG tablet Take 10 mg by mouth daily as needed for allergies.    . metroNIDAZOLE (METROGEL) 1 % gel Apply topically daily. 45 g 3  . morphine (MSIR) 15 MG tablet Take 15 mg by mouth every 6 (six) hours as needed for severe pain.    . pregabalin (LYRICA) 75 MG capsule Take 75 mg by mouth daily.    Marland Kitchen. venlafaxine XR (EFFEXOR XR) 75 MG 24 hr capsule Take 1 capsule (75 mg total) by mouth daily with breakfast. 90 capsule 1    No results found for this or any previous visit (from the past 48 hour(s)). No results found.  ROS: Pain with rom of the left upper extremity  Physical Exam:  Alert and oriented 37 y.o. female in no acute distress Cranial nerves 2-12 intact Cervical spine: full rom with no tenderness, nv intact distally Chest: active breath sounds bilaterally, no wheeze  rhonchi or rales Heart: regular rate and rhythm, no murmur Abd: non tender non distended with active bowel sounds Hip is stable with rom  Left shoulder with limited rom Strength of ER and IR 5/5 bilaterally No rashes or edema  Assessment/Plan Assessment: left shoulder stiffness  Plan: Patient will undergo a left shoulder manipulation and lysis of adhesions  by Dr. Ranell Patrick at [facility]. Risks benefits and expectations were discussed with the patient. Patient understand risks, benefits and expectations and wishes to proceed.  Alphonsa Overall PA-C,  MPAS [facility] is now [employer] [address], [place], [place] [address] Phone: [phone] [email or web address] Facebook  [employer]

## 2017-11-20 ENCOUNTER — Telehealth: Payer: 59 | Admitting: Family

## 2017-11-20 DIAGNOSIS — J019 Acute sinusitis, unspecified: Secondary | ICD-10-CM

## 2017-11-20 MED ORDER — FLUCONAZOLE 150 MG PO TABS: 150.0000 mg | ORAL_TABLET | Freq: Once | ORAL | 0 refills | Status: AC

## 2017-11-20 MED ORDER — AMOXICILLIN-POT CLAVULANATE 875-125 MG PO TABS: 1.0000 | ORAL_TABLET | Freq: Two times a day (BID) | ORAL | 0 refills | Status: DC

## 2017-11-20 NOTE — Progress Notes (Signed)
We are sorry that you are not feeling well.  Here is how we plan to help!  Based on what you have shared with me it looks like you have sinusitis.  Sinusitis is inflammation and infection in the sinus cavities of the head.  Based on your presentation I believe you most likely have Acute Bacterial Sinusitis.  This is an infection caused by bacteria and is treated with antibiotics. I have prescribed Augmentin 875mg /125mg  one tablet twice daily with food, for 7 days. You may use an oral decongestant such as Mucinex D or if you have glaucoma or high blood pressure use plain Mucinex. Saline nasal spray help and can safely be used as often as needed for congestion.  If you develop worsening sinus pain, fever or notice severe headache and vision changes, or if symptoms are not better after completion of antibiotic, please schedule an appointment with a health care provider.    I also sent in diflucan prescription for you as well.   Sinus infections are not as easily transmitted as other respiratory infection, however we still recommend that you avoid close contact with loved ones, especially the very young and elderly.  Remember to wash your hands thoroughly throughout the day as this is the number one way to prevent the spread of infection!  Home Care:  Only take medications as instructed by your medical team.  Complete the entire course of an antibiotic.  Do not take these medications with alcohol.  A steam or ultrasonic humidifier can help congestion.  You can place a towel over your head and breathe in the steam from hot water coming from a faucet.  Avoid close contacts especially the very young and the elderly.  Cover your mouth when you cough or sneeze.  Always remember to wash your hands.  Get Help Right Away If:  You develop worsening fever or sinus pain.  You develop a severe head ache or visual changes.  Your symptoms persist after you have completed your treatment plan.  Make sure  you  Understand these instructions.  Will watch your condition.  Will get help right away if you are not doing well or get worse.  Your e-visit answers were reviewed by a board certified advanced clinical practitioner to complete your personal care plan.  Depending on the condition, your plan could have included both over the counter or prescription medications.  If there is a problem please reply  once you have received a response from your provider.  Your safety is important to us.  If you have drug allergies check your prescription carefully.    You can use MyChart to ask questions about today's visit, request a non-urgent call back, or ask for a work or school excuse for 24 hours related to this e-Visit. If it has been greater than 24 hours you will need to follow up with your provider, or enter a new e-Visit to address those concerns.  You will get an e-mail in the next two days asking about your experience.  I hope that your e-visit has been valuable and will speed your recovery. Thank you for using e-visits.

## 2017-11-27 DIAGNOSIS — M25512 Pain in left shoulder: Secondary | ICD-10-CM | POA: Diagnosis not present

## 2017-11-27 DIAGNOSIS — M25519 Pain in unspecified shoulder: Secondary | ICD-10-CM | POA: Diagnosis not present

## 2017-12-12 ENCOUNTER — Encounter (HOSPITAL_COMMUNITY): Payer: Self-pay | Admitting: *Deleted

## 2017-12-12 ENCOUNTER — Other Ambulatory Visit: Payer: Self-pay

## 2017-12-12 MED ORDER — CEFAZOLIN SODIUM-DEXTROSE 2-4 GM/100ML-% IV SOLN
2.0000 g | INTRAVENOUS | Status: AC
  Administered 2017-12-13: 2 g via INTRAVENOUS
  Filled 2017-12-12: qty 100

## 2017-12-12 NOTE — Progress Notes (Signed)
Spoke with pt for pre-op call. Pt denies cardiac history, HTN or diabetes.  

## 2017-12-13 ENCOUNTER — Ambulatory Visit (HOSPITAL_COMMUNITY): Payer: 59 | Admitting: Anesthesiology

## 2017-12-13 ENCOUNTER — Encounter (HOSPITAL_COMMUNITY): Payer: Self-pay | Admitting: Anesthesiology

## 2017-12-13 ENCOUNTER — Ambulatory Visit (HOSPITAL_COMMUNITY)
Admission: RE | Admit: 2017-12-13 | Discharge: 2017-12-13 | Disposition: A | Discharge status: Home / Self Care | Payer: 59 | Source: Ambulatory Visit | Attending: Orthopedic Surgery | Admitting: Orthopedic Surgery

## 2017-12-13 ENCOUNTER — Encounter (HOSPITAL_COMMUNITY)
Admission: RE | Disposition: A | Discharge status: Home / Self Care | Payer: Self-pay | Source: Ambulatory Visit | Attending: Orthopedic Surgery

## 2017-12-13 DIAGNOSIS — F419 Anxiety disorder, unspecified: Secondary | ICD-10-CM | POA: Diagnosis not present

## 2017-12-13 DIAGNOSIS — M24812 Other specific joint derangements of left shoulder, not elsewhere classified: Secondary | ICD-10-CM | POA: Diagnosis not present

## 2017-12-13 DIAGNOSIS — Z79899 Other long term (current) drug therapy: Secondary | ICD-10-CM | POA: Insufficient documentation

## 2017-12-13 DIAGNOSIS — Z881 Allergy status to other antibiotic agents status: Secondary | ICD-10-CM | POA: Diagnosis not present

## 2017-12-13 DIAGNOSIS — E162 Hypoglycemia, unspecified: Secondary | ICD-10-CM | POA: Diagnosis not present

## 2017-12-13 DIAGNOSIS — M25512 Pain in left shoulder: Secondary | ICD-10-CM | POA: Diagnosis not present

## 2017-12-13 DIAGNOSIS — M25569 Pain in unspecified knee: Secondary | ICD-10-CM | POA: Diagnosis not present

## 2017-12-13 DIAGNOSIS — M419 Scoliosis, unspecified: Secondary | ICD-10-CM | POA: Diagnosis not present

## 2017-12-13 DIAGNOSIS — S43432A Superior glenoid labrum lesion of left shoulder, initial encounter: Secondary | ICD-10-CM | POA: Diagnosis not present

## 2017-12-13 DIAGNOSIS — X58XXXA Exposure to other specified factors, initial encounter: Secondary | ICD-10-CM | POA: Diagnosis not present

## 2017-12-13 DIAGNOSIS — Z87891 Personal history of nicotine dependence: Secondary | ICD-10-CM | POA: Diagnosis not present

## 2017-12-13 DIAGNOSIS — K219 Gastro-esophageal reflux disease without esophagitis: Secondary | ICD-10-CM | POA: Insufficient documentation

## 2017-12-13 DIAGNOSIS — G8918 Other acute postprocedural pain: Secondary | ICD-10-CM | POA: Diagnosis not present

## 2017-12-13 DIAGNOSIS — M65812 Other synovitis and tenosynovitis, left shoulder: Secondary | ICD-10-CM | POA: Diagnosis not present

## 2017-12-13 DIAGNOSIS — S46012A Strain of muscle(s) and tendon(s) of the rotator cuff of left shoulder, initial encounter: Secondary | ICD-10-CM | POA: Insufficient documentation

## 2017-12-13 HISTORY — DX: Other injury of unspecified body region, initial encounter: T14.8XXA

## 2017-12-13 HISTORY — DX: Unspecified osteoarthritis, unspecified site: M19.90

## 2017-12-13 HISTORY — DX: Family history of other specified conditions: Z84.89

## 2017-12-13 HISTORY — DX: Gastro-esophageal reflux disease without esophagitis: K21.9

## 2017-12-13 HISTORY — DX: Anemia, unspecified: D64.9

## 2017-12-13 HISTORY — DX: Adverse effect of unspecified anesthetic, initial encounter: T41.45XA

## 2017-12-13 HISTORY — DX: Pneumonia, unspecified organism: J18.9

## 2017-12-13 HISTORY — DX: Major depressive disorder, single episode, unspecified: F32.9

## 2017-12-13 HISTORY — DX: Unspecified asthma, uncomplicated: J45.909

## 2017-12-13 HISTORY — DX: Other complications of anesthesia, initial encounter: T88.59XA

## 2017-12-13 HISTORY — PX: SHOULDER ARTHROSCOPY: SHX128

## 2017-12-13 HISTORY — DX: Depression, unspecified: F32.A

## 2017-12-13 HISTORY — PX: LYSIS OF ADHESION: SHX5961

## 2017-12-13 LAB — CBC
HEMATOCRIT: 41.1 % (ref 36.0–46.0)
HEMOGLOBIN: 14.1 g/dL (ref 12.0–15.0)
MCH: 29 pg (ref 26.0–34.0)
MCHC: 34.3 g/dL (ref 30.0–36.0)
MCV: 84.6 fL (ref 78.0–100.0)
Platelets: 234 10*3/uL (ref 150–400)
RBC: 4.86 MIL/uL (ref 3.87–5.11)
RDW: 12.4 % (ref 11.5–15.5)
WBC: 6.3 10*3/uL (ref 4.0–10.5)

## 2017-12-13 SURGERY — ARTHROSCOPY, SHOULDER
Anesthesia: General | Site: Shoulder | Laterality: Left

## 2017-12-13 MED ORDER — ONDANSETRON HCL 4 MG/2ML IJ SOLN
INTRAMUSCULAR | Status: DC | PRN
  Administered 2017-12-13: 4 mg via INTRAVENOUS

## 2017-12-13 MED ORDER — MIDAZOLAM HCL 2 MG/2ML IJ SOLN
2.0000 mg | Freq: Once | INTRAMUSCULAR | Status: AC
  Administered 2017-12-13: 2 mg via INTRAVENOUS

## 2017-12-13 MED ORDER — SUGAMMADEX SODIUM 200 MG/2ML IV SOLN
INTRAVENOUS | Status: DC | PRN
  Administered 2017-12-13: 200 mg via INTRAVENOUS

## 2017-12-13 MED ORDER — KETOROLAC TROMETHAMINE 30 MG/ML IJ SOLN
30.0000 mg | Freq: Once | INTRAMUSCULAR | Status: AC
  Administered 2017-12-13: 30 mg via INTRAVENOUS
  Filled 2017-12-13: qty 1

## 2017-12-13 MED ORDER — FENTANYL CITRATE (PF) 100 MCG/2ML IJ SOLN: 25.0000 ug | INTRAMUSCULAR | Status: DC | PRN

## 2017-12-13 MED ORDER — LACTATED RINGERS IV SOLN
INTRAVENOUS | Status: DC
  Administered 2017-12-13: 50 mL/h via INTRAVENOUS

## 2017-12-13 MED ORDER — FENTANYL CITRATE (PF) 100 MCG/2ML IJ SOLN
100.0000 ug | Freq: Once | INTRAMUSCULAR | Status: AC
  Administered 2017-12-13: 100 ug via INTRAVENOUS

## 2017-12-13 MED ORDER — ONDANSETRON HCL 4 MG/2ML IJ SOLN
INTRAMUSCULAR | Status: AC
  Filled 2017-12-13: qty 2

## 2017-12-13 MED ORDER — FENTANYL CITRATE (PF) 100 MCG/2ML IJ SOLN
INTRAMUSCULAR | Status: AC
  Filled 2017-12-13: qty 2

## 2017-12-13 MED ORDER — ROPIVACAINE HCL 7.5 MG/ML IJ SOLN
INTRAMUSCULAR | Status: DC | PRN
  Administered 2017-12-13: 30 mL via PERINEURAL

## 2017-12-13 MED ORDER — ROCURONIUM BROMIDE 100 MG/10ML IV SOLN
INTRAVENOUS | Status: DC | PRN
  Administered 2017-12-13: 25 mg via INTRAVENOUS

## 2017-12-13 MED ORDER — PHENYLEPHRINE 40 MCG/ML (10ML) SYRINGE FOR IV PUSH (FOR BLOOD PRESSURE SUPPORT)
PREFILLED_SYRINGE | INTRAVENOUS | Status: DC | PRN
  Administered 2017-12-13 (×2): 80 ug via INTRAVENOUS

## 2017-12-13 MED ORDER — SODIUM CHLORIDE 0.9 % IR SOLN
Status: DC | PRN
  Administered 2017-12-13 (×2): 3000 mL

## 2017-12-13 MED ORDER — METHOCARBAMOL 500 MG PO TABS: 500.0000 mg | ORAL_TABLET | Freq: Three times a day (TID) | ORAL | 1 refills | Status: DC | PRN

## 2017-12-13 MED ORDER — PROPOFOL 10 MG/ML IV BOLUS
INTRAVENOUS | Status: AC
  Filled 2017-12-13: qty 20

## 2017-12-13 MED ORDER — BUPIVACAINE-EPINEPHRINE (PF) 0.25% -1:200000 IJ SOLN
INTRAMUSCULAR | Status: AC
  Filled 2017-12-13: qty 30

## 2017-12-13 MED ORDER — LIDOCAINE HCL (CARDIAC) 20 MG/ML IV SOLN
INTRAVENOUS | Status: DC | PRN
  Administered 2017-12-13: 60 mg via INTRAVENOUS

## 2017-12-13 MED ORDER — PROPOFOL 10 MG/ML IV BOLUS
INTRAVENOUS | Status: DC | PRN
  Administered 2017-12-13: 160 mg via INTRAVENOUS

## 2017-12-13 MED ORDER — BUPIVACAINE-EPINEPHRINE 0.25% -1:200000 IJ SOLN
INTRAMUSCULAR | Status: DC | PRN
  Administered 2017-12-13: 4 mL

## 2017-12-13 MED ORDER — FENTANYL CITRATE (PF) 250 MCG/5ML IJ SOLN
INTRAMUSCULAR | Status: AC
  Filled 2017-12-13: qty 5

## 2017-12-13 MED ORDER — CLONIDINE HCL (ANALGESIA) 100 MCG/ML EP SOLN
EPIDURAL | Status: DC | PRN
  Administered 2017-12-13: 50 ug

## 2017-12-13 MED ORDER — SUGAMMADEX SODIUM 200 MG/2ML IV SOLN
INTRAVENOUS | Status: AC
  Filled 2017-12-13: qty 2

## 2017-12-13 MED ORDER — MEPERIDINE HCL 50 MG/ML IJ SOLN: 6.2500 mg | INTRAMUSCULAR | Status: DC | PRN

## 2017-12-13 MED ORDER — KETOROLAC TROMETHAMINE 10 MG PO TABS: 10.0000 mg | ORAL_TABLET | Freq: Three times a day (TID) | ORAL | 0 refills | Status: DC

## 2017-12-13 MED ORDER — DEXAMETHASONE SODIUM PHOSPHATE 4 MG/ML IJ SOLN
INTRAMUSCULAR | Status: DC | PRN
  Administered 2017-12-13: 5 mg via INTRAVENOUS
  Administered 2017-12-13: 5 mg via PERINEURAL

## 2017-12-13 MED ORDER — MIDAZOLAM HCL 2 MG/2ML IJ SOLN
INTRAMUSCULAR | Status: AC
  Filled 2017-12-13: qty 2

## 2017-12-13 MED ORDER — KETOROLAC TROMETHAMINE 30 MG/ML IJ SOLN
INTRAMUSCULAR | Status: AC
  Filled 2017-12-13: qty 1

## 2017-12-13 MED ORDER — HYDROMORPHONE HCL 2 MG PO TABS: 2.0000 mg | ORAL_TABLET | Freq: Four times a day (QID) | ORAL | 0 refills | Status: DC | PRN

## 2017-12-13 MED ORDER — PROMETHAZINE HCL 25 MG/ML IJ SOLN: 6.2500 mg | INTRAMUSCULAR | Status: DC | PRN

## 2017-12-13 MED ORDER — CHLORHEXIDINE GLUCONATE 4 % EX LIQD: 60.0000 mL | Freq: Once | CUTANEOUS | Status: DC

## 2017-12-13 MED ORDER — ROCURONIUM BROMIDE 10 MG/ML (PF) SYRINGE
PREFILLED_SYRINGE | INTRAVENOUS | Status: AC
  Filled 2017-12-13: qty 5

## 2017-12-13 MED ORDER — PHENYLEPHRINE 40 MCG/ML (10ML) SYRINGE FOR IV PUSH (FOR BLOOD PRESSURE SUPPORT)
PREFILLED_SYRINGE | INTRAVENOUS | Status: AC
  Filled 2017-12-13: qty 10

## 2017-12-13 MED FILL — KETOROLAC 10 MG TABLET: 10 | 3 days supply | Qty: 9 | Fill #0

## 2017-12-13 MED FILL — METHOCARBAMOL 500 MG TABS: 500 | 20 days supply | Qty: 60 | Fill #0

## 2017-12-13 MED FILL — HYDROmorphone HCL 2 MG TABS: 2 | 7 days supply | Qty: 30 | Fill #0

## 2017-12-13 SURGICAL SUPPLY — 50 items
BLADE CUDA 4.2 (BLADE) IMPLANT
BLADE SURG 11 STRL SS (BLADE) ×3 IMPLANT
BUR OVAL 4.0 (BURR) ×1 IMPLANT
COVER SURGICAL LIGHT HANDLE (MISCELLANEOUS) ×3 IMPLANT
DRAPE IMP U-DRAPE 54X76 (DRAPES) ×3 IMPLANT
DRAPE INCISE IOBAN 66X45 STRL (DRAPES) ×3 IMPLANT
DRAPE ORTHO SPLIT 77X108 STRL (DRAPES) ×6
DRAPE STERI 35X30 U-POUCH (DRAPES) ×3 IMPLANT
DRAPE SURG ORHT 6 SPLT 77X108 (DRAPES) ×4 IMPLANT
DRAPE U-SHAPE 47X51 STRL (DRAPES) ×3 IMPLANT
DRSG EMULSION OIL 3X3 NADH (GAUZE/BANDAGES/DRESSINGS) ×5 IMPLANT
DRSG PAD ABDOMINAL 8X10 ST (GAUZE/BANDAGES/DRESSINGS) ×6 IMPLANT
DURAPREP 26ML APPLICATOR (WOUND CARE) ×3 IMPLANT
ELECT REM PT RETURN 9FT ADLT (ELECTROSURGICAL)
ELECTRODE REM PT RTRN 9FT ADLT (ELECTROSURGICAL) IMPLANT
GAUZE SPONGE 4X4 12PLY STRL (GAUZE/BANDAGES/DRESSINGS) ×3 IMPLANT
GLOVE BIOGEL PI ORTHO PRO 7.5 (GLOVE) ×1
GLOVE BIOGEL PI ORTHO PRO SZ8 (GLOVE) ×1
GLOVE ORTHO TXT STRL SZ7.5 (GLOVE) ×3 IMPLANT
GLOVE PI ORTHO PRO STRL 7.5 (GLOVE) ×2 IMPLANT
GLOVE PI ORTHO PRO STRL SZ8 (GLOVE) ×2 IMPLANT
GLOVE SURG ORTHO 8.5 STRL (GLOVE) ×3 IMPLANT
GOWN STRL REUS W/ TWL XL LVL3 (GOWN DISPOSABLE) ×8 IMPLANT
GOWN STRL REUS W/TWL XL LVL3 (GOWN DISPOSABLE) ×12
KIT BASIN OR (CUSTOM PROCEDURE TRAY) ×3 IMPLANT
KIT ROOM TURNOVER OR (KITS) ×3 IMPLANT
MANIFOLD NEPTUNE II (INSTRUMENTS) ×3 IMPLANT
NDL HYPO 25GX1X1/2 BEV (NEEDLE) ×2 IMPLANT
NDL SPNL 18GX3.5 QUINCKE PK (NEEDLE) ×2 IMPLANT
NEEDLE HYPO 25GX1X1/2 BEV (NEEDLE) ×3 IMPLANT
NEEDLE SPNL 18GX3.5 QUINCKE PK (NEEDLE) ×3 IMPLANT
NS IRRIG 1000ML POUR BTL (IV SOLUTION) ×3 IMPLANT
PACK SHOULDER (CUSTOM PROCEDURE TRAY) ×3 IMPLANT
PACK UNIVERSAL I (CUSTOM PROCEDURE TRAY) ×3 IMPLANT
PAD ARMBOARD 7.5X6 YLW CONV (MISCELLANEOUS) ×6 IMPLANT
PROBE BIPOLAR ATHRO 135MM 90D (MISCELLANEOUS) ×1 IMPLANT
RESECTOR FULL RADIUS 4.2MM (BLADE) ×3 IMPLANT
SET ARTHROSCOPY TUBING (MISCELLANEOUS) ×3
SET ARTHROSCOPY TUBING LN (MISCELLANEOUS) ×2 IMPLANT
SLING ARM FOAM STRAP LRG (SOFTGOODS) ×3 IMPLANT
SLING ARM FOAM STRAP MED (SOFTGOODS) IMPLANT
STRIP CLOSURE SKIN 1/2X4 (GAUZE/BANDAGES/DRESSINGS) ×3 IMPLANT
SUT VIC AB 0 CT2 27 (SUTURE) IMPLANT
SUT VIC AB 2-0 CT1 27 (SUTURE) ×3
SUT VIC AB 2-0 CT1 TAPERPNT 27 (SUTURE) ×2 IMPLANT
SYR CONTROL 10ML LL (SYRINGE) ×3 IMPLANT
TOWEL OR 17X24 6PK STRL BLUE (TOWEL DISPOSABLE) ×3 IMPLANT
TUBE CONNECTING 12X1/4 (SUCTIONS) ×3 IMPLANT
WAND HAND CNTRL MULTIVAC 90 (MISCELLANEOUS) ×3 IMPLANT
WATER STERILE IRR 1000ML POUR (IV SOLUTION) ×3 IMPLANT

## 2017-12-13 NOTE — Anesthesia Procedure Notes (Signed)
Procedure Name: Intubation Date/Time: 12/13/2017 12:39 PM Performed by: Kyung Rudd, CRNA Pre-anesthesia Checklist: Patient identified, Emergency Drugs available, Suction available and Patient being monitored Patient Re-evaluated:Patient Re-evaluated prior to induction Oxygen Delivery Method: Circle system utilized Preoxygenation: Pre-oxygenation with 100% oxygen Induction Type: IV induction Ventilation: Mask ventilation without difficulty Laryngoscope Size: Mac and 3 Grade View: Grade I Tube type: Oral Tube size: 7.0 mm Number of attempts: 1 Airway Equipment and Method: Stylet Placement Confirmation: ETT inserted through vocal cords under direct vision,  positive ETCO2 and breath sounds checked- equal and bilateral Secured at: 21 cm Tube secured with: Tape Dental Injury: Teeth and Oropharynx as per pre-operative assessment

## 2017-12-13 NOTE — Discharge Instructions (Signed)
Ice to the shoulder as much as you can.  Please remove the sling and use the arm for light activity to promote circulation and to promote good blood flow to the arm.  Keep the bandage on for 3 days then ok to change to BandAids  Do exercises every hour for five minutes -   Pendulums Lap Slides Hug and Hitch hike rotations - look at hand as you are doing these  Follow up with Dr Ranell PatrickNorris in two weeks - call [phone] for appt  Please get back on normal pain medicine regimen as soon as you can

## 2017-12-13 NOTE — Transfer of Care (Signed)
Immediate Anesthesia Transfer of Care Note  Patient: Natasha Welch  Procedure(s) Performed: LEFT SHOULDER DIAGNOSTIC ARTHROSCOPY, BURSECTOMY PARTIAL SUBSCAPULAR DEBRIDEMENT. (Left Shoulder) LYSIS OF ADHESION (Left )  Patient Location: PACU  Anesthesia Type:GA combined with regional for post-op pain  Level of Consciousness: awake, alert  and oriented  Airway & Oxygen Therapy: Patient Spontanous Breathing and Patient connected to nasal cannula oxygen  Post-op Assessment: Report given to RN and Post -op Vital signs reviewed and stable  Post vital signs: Reviewed and stable  Last Vitals:  Vitals:   12/13/17 1150 12/13/17 1200  BP: 98/76 96/65  Pulse: 64 83  Resp: 16 12  Temp:    SpO2: 95% 99%    Last Pain:  Vitals:   12/13/17 1035  TempSrc:   PainSc: 7       Patients Stated Pain Goal: 3 (12/13/17 1035)  Complications: No apparent anesthesia complications

## 2017-12-13 NOTE — Brief Op Note (Signed)
12/13/2017  2:11 PM  PATIENT:  Christie Nottinghameresa N Petrey  37 y.o. female  PRE-OPERATIVE DIAGNOSIS:  left shoulder frozen, labral tear  POST-OPERATIVE DIAGNOSIS:  left shoulder pain with small partial thickness subscap tear, no evidence for frozen shoulder  PROCEDURE:  Left shoulder arthroscopy with limited debridement of partial thickness subscapularis tear, arthroscopic subacromial bursectomy  SURGEON:  Surgeon(s) and Role:    Beverely Low* Tymika Grilli, MD - Primary  PHYSICIAN ASSISTANT:   ASSISTANTS: Thea Gisthomas B Dixon, PA-C   ANESTHESIA:   regional and general  EBL:  25 mL   BLOOD ADMINISTERED:none  DRAINS: none   LOCAL MEDICATIONS USED:  MARCAINE     SPECIMEN:  No Specimen  DISPOSITION OF SPECIMEN:  N/A  COUNTS:  YES  TOURNIQUET:  * No tourniquets in log *  DICTATION: .Other Dictation: Dictation Number [id number]  PLAN OF CARE: discharge home  PATIENT DISPOSITION:  PACU - hemodynamically stable.   Delay start of Pharmacological VTE agent (>24hrs) due to surgical blood loss or risk of bleeding: not applicable

## 2017-12-13 NOTE — Anesthesia Preprocedure Evaluation (Addendum)
Anesthesia Evaluation  Patient identified by MRN, date of birth, ID band Patient awake    Reviewed: Allergy & Precautions, NPO status , Patient's Chart, lab work & pertinent test results  History of Anesthesia Complications (+) Family history of anesthesia reaction and history of anesthetic complications  Airway Mallampati: II  TM Distance: >3 FB Neck ROM: Full    Dental no notable dental hx. (+) Teeth Intact, Dental Advisory Given   Pulmonary asthma , pneumonia, former smoker,    Pulmonary exam normal breath sounds clear to auscultation       Cardiovascular negative cardio ROS Normal cardiovascular exam Rhythm:Regular Rate:Normal     Neuro/Psych negative neurological ROS  negative psych ROS   GI/Hepatic Neg liver ROS, GERD  ,  Endo/Other  negative endocrine ROS  Renal/GU negative Renal ROS     Musculoskeletal  (+) Arthritis ,   Abdominal   Peds  Hematology negative hematology ROS (+) anemia ,   Anesthesia Other Findings   Reproductive/Obstetrics negative OB ROS                            Anesthesia Physical Anesthesia Plan  ASA: II  Anesthesia Plan: General   Post-op Pain Management: GA combined w/ Regional for post-op pain   Induction: Intravenous  PONV Risk Score and Plan: 3 and Ondansetron, Dexamethasone and Midazolam  Airway Management Planned: Oral ETT  Additional Equipment:   Intra-op Plan:   Post-operative Plan: Extubation in OR  Informed Consent: I have reviewed the patients History and Physical, chart, labs and discussed the procedure including the risks, benefits and alternatives for the proposed anesthesia with the patient or authorized representative who has indicated his/her understanding and acceptance.   Dental advisory given  Plan Discussed with: CRNA  Anesthesia Plan Comments:         Anesthesia Quick Evaluation

## 2017-12-13 NOTE — Anesthesia Procedure Notes (Signed)
Anesthesia Regional Block: Interscalene brachial plexus block   Pre-Anesthetic Checklist: ,, timeout performed, Correct Patient, Correct Site, Correct Laterality, Correct Procedure, Correct Position, site marked, Risks and benefits discussed,  Surgical consent,  Pre-op evaluation,  At surgeon's request and post-op pain management  Laterality: Left  Prep: chloraprep       Needles:  Injection technique: Single-shot  Needle Type: Stimiplex          Additional Needles:   Procedures:,,,, ultrasound used (permanent image in chart),,,,  Narrative:  Start time: 12/13/2017 11:39 AM End time: 12/13/2017 11:42 AM  Performed by: Personally   Additional Notes: Patient tolerated the procedure well without complications

## 2017-12-13 NOTE — Interval H&P Note (Signed)
History and Physical Interval Note:  12/13/2017 12:14 PM  Natasha Welch  has presented today for surgery, with the diagnosis of left shoulder frozen, labral tear  The various methods of treatment have been discussed with the patient and family. After consideration of risks, benefits and other options for treatment, the patient has consented to  Procedure(s): LEFT SHOULDER ARTHROSCOPY, MANIPULATION UNDER ANESTHESIA, LYSIS OF ADHESIONS, POSSIBLE BICEPS TENODESIS (Left) LYSIS OF ADHESION (Left) as a surgical intervention .  The patient's history has been reviewed, patient examined, no change in status, stable for surgery.  I have reviewed the patient's chart and labs.  Questions were answered to the patient's satisfaction.     Tyrion Glaude,STEVEN R
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
## 2017-12-16 ENCOUNTER — Encounter (HOSPITAL_COMMUNITY): Payer: Self-pay | Admitting: Orthopedic Surgery

## 2017-12-16 NOTE — Anesthesia Postprocedure Evaluation (Signed)
Anesthesia Post Note  Patient: Natasha Welch  Procedure(s) Performed: LEFT SHOULDER DIAGNOSTIC ARTHROSCOPY, BURSECTOMY PARTIAL SUBSCAPULAR DEBRIDEMENT. (Left Shoulder) LYSIS OF ADHESION (Left )     Patient location during evaluation: PACU Anesthesia Type: General Level of consciousness: sedated and patient cooperative Pain management: pain level controlled Vital Signs Assessment: post-procedure vital signs reviewed and stable Respiratory status: spontaneous breathing Cardiovascular status: stable Anesthetic complications: no    Last Vitals:  Vitals:   12/13/17 1421 12/13/17 1435  BP: 107/71   Pulse: 87   Resp: 19   Temp:  36.6 C  SpO2: 96%     Last Pain:  Vitals:   12/13/17 1435  TempSrc:   PainSc: 0-No pain                 Lewie LoronJohn Janice Bodine

## 2018-01-01 DIAGNOSIS — Z79891 Long term (current) use of opiate analgesic: Secondary | ICD-10-CM | POA: Diagnosis not present

## 2018-01-01 DIAGNOSIS — G894 Chronic pain syndrome: Secondary | ICD-10-CM | POA: Diagnosis not present

## 2018-01-08 ENCOUNTER — Other Ambulatory Visit: Payer: Self-pay | Admitting: Family Medicine

## 2018-01-08 NOTE — Telephone Encounter (Signed)
Electronic refill Last office visit 11/13/17 Last refill 11/03/17 #30/1

## 2018-01-09 NOTE — Telephone Encounter (Signed)
Sent. Thanks.   

## 2018-02-06 ENCOUNTER — Encounter: Payer: Self-pay | Admitting: Family Medicine

## 2018-02-07 ENCOUNTER — Other Ambulatory Visit: Payer: Self-pay | Admitting: Family Medicine

## 2018-02-07 MED ORDER — LEVOCETIRIZINE DIHYDROCHLORIDE 5 MG PO TABS: 5.0000 mg | ORAL_TABLET | Freq: Every evening | ORAL | 3 refills | Status: DC

## 2018-02-26 ENCOUNTER — Other Ambulatory Visit: Payer: Self-pay | Admitting: Family Medicine

## 2018-02-26 MED ORDER — AMPHETAMINE-DEXTROAMPHETAMINE 15 MG PO TABS: 15.0000 mg | ORAL_TABLET | Freq: Two times a day (BID) | ORAL | 0 refills | Status: DC

## 2018-02-26 NOTE — Telephone Encounter (Signed)
Last refill 11/15/17 #60 Last office visit 11/13/17

## 2018-02-26 NOTE — Telephone Encounter (Signed)
Sent. Thanks.   

## 2018-03-26 ENCOUNTER — Other Ambulatory Visit: Payer: Self-pay | Admitting: Family Medicine

## 2018-03-26 NOTE — Telephone Encounter (Signed)
Electronic refill request Last refill 12/30/17 #30/1 Last office visit 11/13/17

## 2018-03-27 NOTE — Telephone Encounter (Signed)
Sent. Thanks.   

## 2018-04-30 DIAGNOSIS — H5213 Myopia, bilateral: Secondary | ICD-10-CM | POA: Diagnosis not present

## 2018-04-30 DIAGNOSIS — H52223 Regular astigmatism, bilateral: Secondary | ICD-10-CM | POA: Diagnosis not present

## 2018-05-28 ENCOUNTER — Encounter: Payer: Self-pay | Admitting: Family Medicine

## 2018-05-28 ENCOUNTER — Ambulatory Visit (INDEPENDENT_AMBULATORY_CARE_PROVIDER_SITE_OTHER): Payer: 59 | Admitting: Family Medicine

## 2018-05-28 VITALS — BP 118/68 | HR 89 | Temp 98.3°F | Ht 65.0 in | Wt 192.0 lb

## 2018-05-28 DIAGNOSIS — Z0000 Encounter for general adult medical examination without abnormal findings: Secondary | ICD-10-CM | POA: Diagnosis not present

## 2018-05-28 DIAGNOSIS — Z7189 Other specified counseling: Secondary | ICD-10-CM

## 2018-05-28 DIAGNOSIS — G8929 Other chronic pain: Secondary | ICD-10-CM

## 2018-05-28 DIAGNOSIS — N644 Mastodynia: Secondary | ICD-10-CM

## 2018-05-28 DIAGNOSIS — F988 Other specified behavioral and emotional disorders with onset usually occurring in childhood and adolescence: Secondary | ICD-10-CM

## 2018-05-28 DIAGNOSIS — G43009 Migraine without aura, not intractable, without status migrainosus: Secondary | ICD-10-CM

## 2018-05-28 DIAGNOSIS — F4320 Adjustment disorder, unspecified: Secondary | ICD-10-CM

## 2018-05-28 DIAGNOSIS — R21 Rash and other nonspecific skin eruption: Secondary | ICD-10-CM

## 2018-05-28 DIAGNOSIS — M25512 Pain in left shoulder: Secondary | ICD-10-CM

## 2018-05-28 MED ORDER — AMPHETAMINE-DEXTROAMPHETAMINE 15 MG PO TABS: 15.0000 mg | ORAL_TABLET | Freq: Two times a day (BID) | ORAL | 0 refills | Status: DC

## 2018-05-28 MED ORDER — ALPRAZOLAM 0.5 MG PO TABS: ORAL_TABLET | ORAL | 1 refills | Status: DC

## 2018-05-28 MED ORDER — METRONIDAZOLE 1 % EX GEL: 1.0000 "application " | Freq: Every day | CUTANEOUS | 1 refills | Status: DC

## 2018-05-28 NOTE — Patient Instructions (Signed)
We will call about your referral.  Shirlee LimerickMarion or Alvina Chounastasiya will call you if you don't see one of them on the way out.  Set an alarm on your phone about the venlafaxine and see if that helps.  Update me as needed.  Take care.  Glad to see you.

## 2018-05-28 NOTE — Progress Notes (Signed)
CPE- See plan.  Routine anticipatory guidance given to patient.  See health maintenance.  The possibility exists that previously documented standard health maintenance information may have been brought forward from a previous encounter into this note.  If needed, that same information has been updated to reflect the current situation based on today's encounter.    Pap and DXA not due Mammogram d/w pt.  See below.  Colon cancer screening not due tdap 2015 Flu shot done at work yearly PNA and shingles not due Living will d/w pt- she would have her mother designated if patient were incapacitated.  Diet and exercise d/w pt.  HIV screen neg ~2002 per patient.    She had prev noted some "knots" in L breast and L breast pain prev.  She cut back on caffeine and that helped with breast sx.  That may have contributed to the headaches (cutting back on caffeine).  She declined breast exam.  History of implants noted.  She had noted a lesion with tenderness at 6:00 on the left breast, inferior to the nipple.  1-2 migraines without aura per week recently.  H/o similar in the past.  Has sig pressure sensation in the head with headaches.  Photophobia, phonophobia.  Nausea with the events.    Her daughter is married (daughter's spouse is in [employer]) and will be living in [place].  She hopes to travel there in the near future.    Pain meds per outside clinic.  I'll defer.  She is still putting up with shoulder pain, some better than prior to surgery but still with mobility troubles.    ADD.  Concentration affected by other stressors, see below.  Still using med with some effect.  No adverse effect on medication.  Compliant.  Mood d/w pt.  She has a demanding work environment and she is trying to manage that.  D/w pt.  Her boyfriend died and d/w pt.  She has sig stressors noted.  She has some relief of sx with venlafaxine but had forgotten to take some doses of med.  Still with fatigue.  No si/hi.  Discussed  with patient about planning to increase compliance with venlafaxine.  This may help with mood/migraines/etc.  PMH and SH reviewed  Meds, vitals, and allergies reviewed.   ROS: Per HPI.  Unless specifically indicated otherwise in HPI, the patient denies:  General: fever. Eyes: acute vision changes ENT: sore throat Cardiovascular: chest pain Respiratory: SOB GI: vomiting GU: dysuria Musculoskeletal: acute back pain Derm: acute rash Neuro: acute motor dysfunction Psych: worsening mood Endocrine: polydipsia Heme: bleeding Allergy: hayfever  GEN: nad, alert and oriented, tearful but regains composure.  Speech and judgment intact.  No suicidal or homicidal intent. HEENT: mucous membranes moist NECK: supple w/o LA CV: rrr. PULM: ctab, no inc wob ABD: soft, +bs EXT: no edema SKIN: no acute rash

## 2018-05-29 DIAGNOSIS — N644 Mastodynia: Secondary | ICD-10-CM | POA: Insufficient documentation

## 2018-05-29 NOTE — Assessment & Plan Note (Signed)
Likely with more symptoms since she has tapered caffeine.  Reasonable to increase compliance with venlafaxine and continue caffeine taper and update me as needed.  She agrees.

## 2018-05-29 NOTE — Assessment & Plan Note (Signed)
Pap and DXA not due Mammogram d/w pt.  See below.  Colon cancer screening not due tdap 2015 Flu shot done at work yearly PNA and shingles not due Living will d/w pt- she would have her mother designated if patient were incapacitated.  Diet and exercise d/w pt.  HIV screen neg ~2002 per patient.

## 2018-05-29 NOTE — Assessment & Plan Note (Signed)
Per outside clinic.  I will defer.  She agrees. 

## 2018-05-29 NOTE — Assessment & Plan Note (Signed)
See above.  Significant social available.  Discussed with patient about restart of venlafaxine and setting an alarm on her phone to help increase compliance.  She will do that and then let me know how she is getting along.  Use Xanax as needed in the meantime.  No adverse effect on the medication.  She is trying to manage her work situation.

## 2018-05-29 NOTE — Assessment & Plan Note (Signed)
Continue Adderall.  No adverse effect of medication.  See above about venlafaxine use.

## 2018-05-29 NOTE — Assessment & Plan Note (Signed)
Continue metronidazole as needed.

## 2018-05-29 NOTE — Assessment & Plan Note (Signed)
Living will d/w pt- she would have her mother designated if patient were incapacitated. 

## 2018-05-29 NOTE — Assessment & Plan Note (Signed)
By patient history, likely cystic changes that were caffeine responsive.  Refer for him.  See orders.  Discussed with patient.

## 2018-06-25 DIAGNOSIS — Z79891 Long term (current) use of opiate analgesic: Secondary | ICD-10-CM | POA: Diagnosis not present

## 2018-06-27 ENCOUNTER — Encounter: Payer: Self-pay | Admitting: Family Medicine

## 2018-07-23 ENCOUNTER — Telehealth: Payer: 59 | Admitting: Nurse Practitioner

## 2018-07-23 DIAGNOSIS — J0100 Acute maxillary sinusitis, unspecified: Secondary | ICD-10-CM | POA: Diagnosis not present

## 2018-07-23 MED ORDER — FLUCONAZOLE 150 MG PO TABS: 150.0000 mg | ORAL_TABLET | Freq: Once | ORAL | 0 refills | Status: AC

## 2018-07-23 MED ORDER — AMOXICILLIN-POT CLAVULANATE 875-125 MG PO TABS: 1.0000 | ORAL_TABLET | Freq: Two times a day (BID) | ORAL | 0 refills | Status: DC

## 2018-07-23 NOTE — Progress Notes (Signed)

## 2018-08-20 ENCOUNTER — Other Ambulatory Visit: Payer: Self-pay | Admitting: Family Medicine

## 2018-08-20 NOTE — Telephone Encounter (Signed)
Electronic refill request Alprazolam Last refill 05/28/18 #30/1 Last office visit 05/28/18

## 2018-08-21 NOTE — Telephone Encounter (Signed)
Sent. Thanks.   

## 2018-08-26 ENCOUNTER — Other Ambulatory Visit: Payer: Self-pay | Admitting: Family Medicine

## 2018-08-26 NOTE — Telephone Encounter (Signed)
Name of Medication: Adderall 15 mg [facility] Last Fill or Written Date and Quantity: # 60 x 2 on 05/28/18 Last Office Visit and Type:05/28/18 annual  Next Office Visit and Type: none scheduled

## 2018-08-27 MED ORDER — AMPHETAMINE-DEXTROAMPHETAMINE 15 MG PO TABS: 15.0000 mg | ORAL_TABLET | Freq: Two times a day (BID) | ORAL | 0 refills | Status: DC

## 2018-08-27 NOTE — Telephone Encounter (Signed)
Sent. Thanks.   

## 2018-10-29 ENCOUNTER — Other Ambulatory Visit: Payer: Self-pay | Admitting: Family Medicine

## 2018-10-29 NOTE — Telephone Encounter (Signed)
Sent. Thanks.  Okay to continue. 

## 2018-10-29 NOTE — Telephone Encounter (Signed)
Last office visit 05/28/2018 for CPE.  Last refilled 08/21/2018 for #30 with 1 refill.  No future appointments.

## 2018-11-27 ENCOUNTER — Other Ambulatory Visit: Payer: Self-pay | Admitting: Family Medicine

## 2018-11-28 NOTE — Telephone Encounter (Signed)
Last filled 10-26-18 #60 Last OV/CPE 05-28-18  No Future OV [facility]

## 2018-11-30 MED ORDER — AMPHETAMINE-DEXTROAMPHETAMINE 15 MG PO TABS: 15.0000 mg | ORAL_TABLET | Freq: Two times a day (BID) | ORAL | 0 refills | Status: DC

## 2018-11-30 NOTE — Telephone Encounter (Signed)
Sent. Thanks.   

## 2018-12-08 ENCOUNTER — Encounter: Payer: Self-pay | Admitting: *Deleted

## 2018-12-08 ENCOUNTER — Ambulatory Visit (INDEPENDENT_AMBULATORY_CARE_PROVIDER_SITE_OTHER): Payer: No Typology Code available for payment source | Admitting: Family Medicine

## 2018-12-08 ENCOUNTER — Encounter: Payer: Self-pay | Admitting: Family Medicine

## 2018-12-08 VITALS — BP 112/72 | HR 67 | Temp 98.7°F | Ht 65.0 in | Wt 183.5 lb

## 2018-12-08 DIAGNOSIS — M549 Dorsalgia, unspecified: Secondary | ICD-10-CM

## 2018-12-08 DIAGNOSIS — G8929 Other chronic pain: Secondary | ICD-10-CM | POA: Diagnosis not present

## 2018-12-08 DIAGNOSIS — F4320 Adjustment disorder, unspecified: Secondary | ICD-10-CM

## 2018-12-08 MED ORDER — ALPRAZOLAM 0.5 MG PO TABS: ORAL_TABLET | ORAL | 1 refills | Status: DC

## 2018-12-08 MED ORDER — VENLAFAXINE HCL ER 150 MG PO CP24: 150.0000 mg | ORAL_CAPSULE | Freq: Every day | ORAL | 3 refills | Status: DC

## 2018-12-08 NOTE — Patient Instructions (Addendum)
We'll call about ortho and seeing Dr. Laymond Purser.  Try the higher dose of effexor and see if that helps.  Update me as needed.  Take care.  Glad to see you.
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
## 2018-12-10 NOTE — Assessment & Plan Note (Addendum)
I think it makes a lot of sense for her to go through with counseling.  Referral placed for psychology follow-up.  No suicidal homicidal intent and still okay for outpatient follow-up.  Would increase Effexor to 150 mg a day for now.  She is trying to see about options with transferring out of her job.  She has been unsuccessful in transferring so far.  She is safe at home.  She will let me know how she is doing otherwise.

## 2018-12-10 NOTE — Assessment & Plan Note (Signed)
She has follow-up pending.  I will defer.  Referral placed.  I appreciate the help of all involved.

## 2019-01-07 ENCOUNTER — Telehealth: Payer: No Typology Code available for payment source | Admitting: Physician Assistant

## 2019-01-07 DIAGNOSIS — N3000 Acute cystitis without hematuria: Secondary | ICD-10-CM

## 2019-01-07 MED ORDER — NITROFURANTOIN MONOHYD MACRO 100 MG PO CAPS: 100.0000 mg | ORAL_CAPSULE | Freq: Two times a day (BID) | ORAL | 0 refills | Status: DC

## 2019-01-07 NOTE — Progress Notes (Signed)

## 2019-03-09 ENCOUNTER — Encounter: Payer: Self-pay | Admitting: Family Medicine

## 2019-03-09 ENCOUNTER — Other Ambulatory Visit: Payer: Self-pay | Admitting: Family Medicine

## 2019-03-09 MED ORDER — LEVOCETIRIZINE DIHYDROCHLORIDE 5 MG PO TABS: 5.0000 mg | ORAL_TABLET | Freq: Every evening | ORAL | 3 refills | Status: DC

## 2019-03-09 NOTE — Telephone Encounter (Signed)
Electronic refill request. Adderall Last office visit:   12/08/2018 Last Filled:    60 tablet 0 11/30/2018  Please advise.   Electronic refill request. Alprazolam Last office visit:   12/08/2018 Last Filled:    30 tablet 1 12/08/2018  Please advise.

## 2019-03-10 MED ORDER — AMPHETAMINE-DEXTROAMPHETAMINE 15 MG PO TABS: 15.0000 mg | ORAL_TABLET | Freq: Two times a day (BID) | ORAL | 0 refills | Status: DC

## 2019-03-10 MED ORDER — ALPRAZOLAM 0.5 MG PO TABS: ORAL_TABLET | ORAL | 1 refills | Status: DC

## 2019-03-10 NOTE — Telephone Encounter (Signed)
Sent. Thanks.   

## 2019-04-21 ENCOUNTER — Other Ambulatory Visit: Payer: Self-pay

## 2019-04-21 DIAGNOSIS — Z20822 Contact with and (suspected) exposure to covid-19: Secondary | ICD-10-CM

## 2019-05-29 ENCOUNTER — Other Ambulatory Visit: Payer: Self-pay | Admitting: Family Medicine

## 2019-05-29 NOTE — Telephone Encounter (Signed)
Electronic refill request. Alprazolam Last office visit:   12/08/2018 Acute Last Filled:    30 tablet 1 03/10/2019  Please advise.

## 2019-05-31 NOTE — Telephone Encounter (Signed)
Sent. Thanks.   

## 2019-06-12 ENCOUNTER — Other Ambulatory Visit: Payer: Self-pay | Admitting: Family Medicine

## 2019-06-12 NOTE — Telephone Encounter (Signed)
Electronic refill request. Adderall 15 mg Last office visit:   12/08/2018 Last Filled:     60 tablet 0 03/10/2019  Please advise.

## 2019-06-14 MED ORDER — AMPHETAMINE-DEXTROAMPHETAMINE 15 MG PO TABS: 15.0000 mg | ORAL_TABLET | Freq: Two times a day (BID) | ORAL | 0 refills | Status: DC

## 2019-06-14 NOTE — Telephone Encounter (Signed)
Sent. Thanks.   

## 2019-06-23 ENCOUNTER — Encounter: Payer: Self-pay | Admitting: Family Medicine

## 2019-06-25 ENCOUNTER — Other Ambulatory Visit: Payer: Self-pay | Admitting: Family Medicine

## 2019-06-25 DIAGNOSIS — M549 Dorsalgia, unspecified: Secondary | ICD-10-CM

## 2019-07-27 ENCOUNTER — Other Ambulatory Visit: Payer: Self-pay | Admitting: Family Medicine

## 2019-07-27 NOTE — Telephone Encounter (Signed)
Electronic refill request. Alprazolam Last office visit:   12/08/2018 Last Filled:     30 tablet 1 05/31/2019  Please advise.

## 2019-07-28 NOTE — Telephone Encounter (Signed)
Sent. Thanks.   
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
## 2019-09-23 ENCOUNTER — Other Ambulatory Visit: Payer: Self-pay | Admitting: Family Medicine

## 2019-09-23 MED ORDER — AMPHETAMINE-DEXTROAMPHETAMINE 15 MG PO TABS: 15.0000 mg | ORAL_TABLET | Freq: Two times a day (BID) | ORAL | 0 refills | Status: DC

## 2019-09-23 NOTE — Telephone Encounter (Signed)
Sent. Thanks.   

## 2019-09-23 NOTE — Telephone Encounter (Signed)
Name of Medication: Alprazolam Name of Pharmacy: [facility] or Written Date and Quantity: 07/28/2019 #30 with 1 refill Last Office Visit and Type: 12/08/2018 follow up Next Office Visit and Type: none Last Controlled Substance Agreement Date: none Last UDS: 06/10/14

## 2019-09-23 NOTE — Telephone Encounter (Signed)
Name of Medication: Adderall 15 mg Name of Pharmacy: [facility] or Written Date and Quantity: 06/12/2019 x 3 RXs Last Office Visit and Type: 12/08/2018 follow up Next Office Visit and Type: none Last Controlled Substance Agreement Date: none Last UDS: 06/10/14  \

## 2019-11-03 ENCOUNTER — Encounter: Payer: Self-pay | Admitting: Family Medicine

## 2019-12-23 ENCOUNTER — Telehealth: Payer: No Typology Code available for payment source | Admitting: Family

## 2019-12-23 DIAGNOSIS — J019 Acute sinusitis, unspecified: Secondary | ICD-10-CM | POA: Diagnosis not present

## 2019-12-23 MED ORDER — AMOXICILLIN-POT CLAVULANATE 875-125 MG PO TABS: 1.0000 | ORAL_TABLET | Freq: Two times a day (BID) | ORAL | 0 refills | Status: DC

## 2019-12-23 NOTE — Progress Notes (Signed)

## 2020-01-01 ENCOUNTER — Other Ambulatory Visit: Payer: Self-pay | Admitting: Family Medicine

## 2020-01-04 NOTE — Telephone Encounter (Signed)
Electronic refill request. Adderall Last office visit:   12/08/2018 Adjustment Disorder Last Filled:    60 tablet 0 09/23/2019  Please advise.

## 2020-01-05 MED ORDER — AMPHETAMINE-DEXTROAMPHETAMINE 15 MG PO TABS: 15.0000 mg | ORAL_TABLET | Freq: Two times a day (BID) | ORAL | 0 refills | Status: DC

## 2020-01-05 NOTE — Telephone Encounter (Signed)
Sent. Thanks.  Needs f/u for yearly visit when possible.

## 2020-01-05 NOTE — Telephone Encounter (Signed)
Letter mailed

## 2020-01-08 ENCOUNTER — Telehealth: Payer: No Typology Code available for payment source | Admitting: Physician Assistant

## 2020-01-08 DIAGNOSIS — M542 Cervicalgia: Secondary | ICD-10-CM

## 2020-01-08 MED ORDER — CYCLOBENZAPRINE HCL 10 MG PO TABS: 10.0000 mg | ORAL_TABLET | Freq: Three times a day (TID) | ORAL | 0 refills | Status: DC | PRN

## 2020-01-08 MED ORDER — ETODOLAC 300 MG PO CAPS: 300.0000 mg | ORAL_CAPSULE | Freq: Three times a day (TID) | ORAL | 0 refills | Status: DC

## 2020-01-08 NOTE — Progress Notes (Signed)
We are sorry that you are not feeling well.  Here is how we plan to help!  Based on what you have shared with me it looks like you mostly have acute neck and shoulder pain.  Acute pain is defined as musculoskeletal pain that can resolve in 1-3 weeks with conservative treatment.  If you are not improving, please follow-up with your PCP.   I have prescribed Etodolac 300 mg take one by mouth twice a day non-steroid anti-inflammatory (NSAID) as well as Flexeril 10 mg every eight hours as needed which is a muscle relaxer  Some patients experience stomach irritation or in increased heartburn with anti-inflammatory drugs.  Please keep in mind that muscle relaxer's can cause fatigue and should not be taken while at work or driving.  Back pain is very common.  The pain often gets better over time.  The cause of back pain is usually not dangerous.  Most people can learn to manage their back pain on their own.  Home Care  Stay active.  Start with short walks on flat ground if you can.  Try to walk farther each day.  Apply heat to the area 1-3 times daily.   Perform light stretches for the area.  Massage the area.  Do not sit, drive or stand in one place for more than 30 minutes.  Do not stay in bed.  Do not avoid exercise or work.  Activity can help your back heal faster.  Be careful when you bend or lift an object.  Bend at your knees, keep the object close to you, and do not twist.  Sleep on a firm mattress.  Lie on your side, and bend your knees.  If you lie on your back, put a pillow under your knees.  Only take medicines as told by your doctor.  Put ice on the injured area.  Put ice in a plastic bag  Place a towel between your skin and the bag  Leave the ice on for 15-20 minutes, 3-4 times a day for the first 2-3 days. 210 After that, you can switch between ice and heat packs.  Ask your doctor about back exercises or massage.  Avoid feeling anxious or stressed.  Find good ways to  deal with stress, such as exercise.  Get Help Right Way If:  Your pain does not go away with rest or medicine.  Your pain does not go away in 1 week.  You have new problems.  You do not feel well.  The pain spreads into your legs.  You cannot control when you poop (bowel movement) or pee (urinate)  You feel sick to your stomach (nauseous) or throw up (vomit)  You have belly (abdominal) pain.  You feel like you may pass out (faint).  If you develop a fever.  Make Sure you:  Understand these instructions.  Will watch your condition  Will get help right away if you are not doing well or get worse.  Your e-visit answers were reviewed by a board certified advanced clinical practitioner to complete your personal care plan.  Depending on the condition, your plan could have included both over the counter or prescription medications.  If there is a problem please reply  once you have received a response from your provider.  Your safety is important to [place].  If you have drug allergies check your prescription carefully.    You can use MyChart to ask questions about today's visit, request a non-urgent call back, or  ask for a work or school excuse for 24 hours related to this e-Visit. If it has been greater than 24 hours you will need to follow up with your provider, or enter a new e-Visit to address those concerns.  You will get an e-mail in the next two days asking about your experience.  I hope that your e-visit has been valuable and will speed your recovery. Thank you for using e-visits.  Greater than 5 minutes, yet less than 10 minutes of time have been spent researching, coordinating and implementing care for this patient today.

## 2020-02-03 ENCOUNTER — Other Ambulatory Visit: Payer: Self-pay | Admitting: Family Medicine

## 2020-02-04 NOTE — Telephone Encounter (Signed)
Electronic refill request. Venlafaxine Last office visit:   12/08/2018 Last Filled:    90 capsule 3 12/08/2018   Electronic refill request. Amphetamine-Dextroamphetamine Last office visit:   12/08/2018 Last Filled:    60 tablet 0 09/23/2019  Please advise.   No upcoming appts scheduled.

## 2020-02-04 NOTE — Telephone Encounter (Signed)
Submitted to MD for approval on a previous request.

## 2020-02-05 ENCOUNTER — Other Ambulatory Visit: Payer: Self-pay | Admitting: Family Medicine

## 2020-02-05 NOTE — Telephone Encounter (Signed)
Sent with other request.  Thanks.

## 2020-02-05 NOTE — Telephone Encounter (Signed)
Sent. Thanks.   Reasonable for yearly visit when possible.   

## 2020-02-05 NOTE — Telephone Encounter (Signed)
Left detailed message on voicemail.  

## 2020-02-19 ENCOUNTER — Other Ambulatory Visit: Payer: Self-pay | Admitting: Family Medicine

## 2020-02-19 NOTE — Telephone Encounter (Signed)
Electronic refill request. Alprazolam Last office visit:   12/08/2018 Last Filled:    30 tablet 1 09/23/2019  Please advise.

## 2020-02-21 NOTE — Telephone Encounter (Signed)
Sent. Thanks.  Needs yearly CPE when possible.

## 2020-02-22 ENCOUNTER — Telehealth: Payer: Self-pay | Admitting: Family Medicine

## 2020-02-22 NOTE — Telephone Encounter (Signed)
Called patient and left voicemail for patient to call office and schedule CPE and labs. 

## 2020-02-22 NOTE — Telephone Encounter (Signed)
Please call patient and scheduled appointment as instructed. 

## 2020-03-15 ENCOUNTER — Encounter: Payer: Self-pay | Admitting: Family Medicine

## 2020-03-16 ENCOUNTER — Other Ambulatory Visit: Payer: Self-pay | Admitting: Family Medicine

## 2020-03-16 MED ORDER — AMPHETAMINE-DEXTROAMPHETAMINE 15 MG PO TABS: 15.0000 mg | ORAL_TABLET | Freq: Two times a day (BID) | ORAL | 0 refills | Status: DC

## 2020-03-16 MED ORDER — AMPHETAMINE-DEXTROAMPHETAMINE 15 MG PO TABS: 1.0000 | ORAL_TABLET | Freq: Two times a day (BID) | ORAL | 0 refills | Status: DC

## 2020-03-17 ENCOUNTER — Telehealth: Payer: Self-pay | Admitting: Family Medicine

## 2020-03-17 NOTE — Telephone Encounter (Signed)
Patient scheduled her CPE on 6/29 She would like to [facility].  Would you like [place] to fax these orders over or would you like to fill out the lab sheet???   Thanks!     FAX- [phone]

## 2020-03-20 ENCOUNTER — Encounter: Payer: Self-pay | Admitting: Family Medicine

## 2020-03-20 NOTE — Telephone Encounter (Signed)
Please print and fax this phone note.  That should cover it.  Thanks.   Crawford Givens, MD.   BMET, CBC, TSH-  Dx: V61.224 Lipid panel- Dx: [phone]

## 2020-03-21 NOTE — Telephone Encounter (Signed)
Note faxed.

## 2020-03-29 ENCOUNTER — Encounter: Payer: Self-pay | Admitting: Family Medicine

## 2020-03-29 ENCOUNTER — Other Ambulatory Visit: Payer: Self-pay

## 2020-03-29 ENCOUNTER — Ambulatory Visit (INDEPENDENT_AMBULATORY_CARE_PROVIDER_SITE_OTHER): Payer: No Typology Code available for payment source | Admitting: Family Medicine

## 2020-03-29 ENCOUNTER — Other Ambulatory Visit: Payer: Self-pay | Admitting: Family Medicine

## 2020-03-29 VITALS — BP 110/64 | HR 92 | Temp 96.5°F | Ht 64.25 in | Wt 218.1 lb

## 2020-03-29 DIAGNOSIS — Z0000 Encounter for general adult medical examination without abnormal findings: Secondary | ICD-10-CM

## 2020-03-29 DIAGNOSIS — F988 Other specified behavioral and emotional disorders with onset usually occurring in childhood and adolescence: Secondary | ICD-10-CM

## 2020-03-29 DIAGNOSIS — G8929 Other chronic pain: Secondary | ICD-10-CM

## 2020-03-29 DIAGNOSIS — F4320 Adjustment disorder, unspecified: Secondary | ICD-10-CM

## 2020-03-29 DIAGNOSIS — R5383 Other fatigue: Secondary | ICD-10-CM

## 2020-03-29 DIAGNOSIS — R21 Rash and other nonspecific skin eruption: Secondary | ICD-10-CM

## 2020-03-29 DIAGNOSIS — M542 Cervicalgia: Secondary | ICD-10-CM

## 2020-03-29 DIAGNOSIS — Z7189 Other specified counseling: Secondary | ICD-10-CM

## 2020-03-29 MED ORDER — ETODOLAC 300 MG PO CAPS: 300.0000 mg | ORAL_CAPSULE | Freq: Two times a day (BID) | ORAL | 3 refills | Status: DC | PRN

## 2020-03-29 MED ORDER — METRONIDAZOLE 1 % EX GEL: 1.0000 "application " | Freq: Every day | CUTANEOUS | 3 refills | Status: DC

## 2020-03-29 NOTE — Progress Notes (Signed)
This visit occurred during the SARS-CoV-2 public health emergency.  Safety protocols were in place, including screening questions prior to the visit, additional usage of staff PPE, and extensive cleaning of exam room while observing appropriate contact time as indicated for disinfecting solutions.  CPE- See plan.  Routine anticipatory guidance given to patient.  See health maintenance.  The possibility exists that previously documented standard health maintenance information may have been brought forward from a previous encounter into this note.  If needed, that same information has been updated to reflect the current situation based on today's encounter.    Pap and DXA not due. Mammogram d/w pt.  Encouraged.   Colon cancer screening not due tdap 2015 covid vaccine 2021 Flu shot done at work yearly PNA and shingles not due Living will d/w pt- she would have her mother designated if patient were incapacitated.  Diet and exercise d/w pt.  HIV screen neg ~2002 per patient.  HCV prev done with prenatal labs.    Chronic pain per outside clinic.  No adverse effect on medication.  Mood is okay with venlafaxine and it helps.  No adverse effect on medication.  She would continue as is.  Garrison [place] born 03/03/2022.  He is doing well.  She is happy about that.  Her daughter is living in [place].  ADD on adderall.  Taking 15mg  BID.  It was helpful for a long time but she has more trouble with focus recently.  Less effect and less duration now.    lodine helped more than ibuprofen for pain.  Discussed options.  Needed refill on metrogel.  It helps with facial rash.   Sleep troubles d/w pt.  She prev had trouble with initiation but now has persistent fatigue.  She is working 7 days a week, most weeks.  She isn't waking gasping for air.  She snores.  Unknown if she has sleep apnea.  PMH and SH reviewed  Meds, vitals, and allergies reviewed.   ROS: Per HPI.  Unless specifically indicated  otherwise in HPI, the patient denies:  General: fever. Eyes: acute vision changes ENT: sore throat Cardiovascular: chest pain Respiratory: SOB GI: vomiting GU: dysuria Musculoskeletal: acute back pain Derm: acute rash Neuro: acute motor dysfunction Psych: worsening mood Endocrine: polydipsia Heme: bleeding Allergy: hayfever  GEN: nad, alert and oriented HEENT: NCAT NECK: supple w/o LA CV: rrr. PULM: ctab, no inc wob ABD: soft, +bs EXT: no edema SKIN: no acute rash

## 2020-03-29 NOTE — Patient Instructions (Addendum)
Get labs done when possible.   Take care.  Glad to see you. Update me as needed.  If you labs are normal, then we may need to consider pulmonary eval for OSA.

## 2020-03-30 ENCOUNTER — Encounter: Payer: Self-pay | Admitting: Family Medicine

## 2020-03-30 NOTE — Assessment & Plan Note (Signed)
Continue MetroGel. 

## 2020-03-30 NOTE — Assessment & Plan Note (Addendum)
Per outside clinic for most of her medications.  Reasonable to try Lodine instead of ibuprofen with routine NSAID cautions discussed.

## 2020-03-30 NOTE — Assessment & Plan Note (Signed)
Living will d/w pt- she would have her mother designated if patient were incapacitated. 

## 2020-03-30 NOTE — Assessment & Plan Note (Signed)
Pap and DXA not due. Mammogram d/w pt.  Encouraged.   Colon cancer screening not due tdap 2015 covid vaccine 2021 Flu shot done at work yearly PNA and shingles not due Living will d/w pt- she would have her mother designated if patient were incapacitated. Diet and exercise d/w pt. HIV screen neg ~2002 per patient.   HCV prev done with prenatal labs.   

## 2020-03-30 NOTE — Assessment & Plan Note (Signed)
Mood is okay with venlafaxine and it helps.  No adverse effect on medication.  She would continue as is.

## 2020-03-30 NOTE — Assessment & Plan Note (Signed)
Unclear about the source of her fatigue.  Check routine labs today.  We may need to refer her for sleep apnea testing.  If she has some other cause for fatigue it clearly could affect her concentration.  Discussed.

## 2020-03-30 NOTE — Assessment & Plan Note (Signed)
No change in medication at this point.  Unclear about the source of her fatigue.  Check routine labs today.  We may need to refer her for sleep apnea testing.  If she has some other cause for fatigue it clearly could affect her concentration.  Discussed.

## 2020-05-02 ENCOUNTER — Telehealth: Payer: Self-pay | Admitting: *Deleted

## 2020-05-02 NOTE — Telephone Encounter (Signed)
-----   Message from [name], MD sent at 05/01/2020 10:52 PM EDT ----- Please check with patient.  Has she had her follow-up labs done?  I have not seen those come through.  Thanks.  Natasha Welch

## 2020-05-02 NOTE — Telephone Encounter (Signed)
Patient was contacted and says she has not had it done yet.  The order was in the floorboard of her car and a drink busted on it.  She is going to reprint the order from MyChart.  Patient advised to contact us if she has any problems and says she will get it done soon.

## 2020-05-04 ENCOUNTER — Telehealth: Payer: No Typology Code available for payment source | Admitting: Nurse Practitioner

## 2020-05-04 DIAGNOSIS — J0100 Acute maxillary sinusitis, unspecified: Secondary | ICD-10-CM

## 2020-05-04 MED ORDER — AMOXICILLIN-POT CLAVULANATE 875-125 MG PO TABS
1.0000 | ORAL_TABLET | Freq: Two times a day (BID) | ORAL | 0 refills | Status: DC
Start: 2020-05-04 — End: 2020-07-25

## 2020-05-04 MED ORDER — FLUCONAZOLE 150 MG PO TABS
150.0000 mg | ORAL_TABLET | Freq: Once | ORAL | 0 refills | Status: AC
Start: 2020-05-04 — End: 2020-05-04

## 2020-05-04 NOTE — Progress Notes (Signed)
We are sorry that you are not feeling well.  Here is how we plan to help!  Based on what you have shared with me it looks like you have sinusitis.  Sinusitis is inflammation and infection in the sinus cavities of the head.  Based on your presentation I believe you most likely have Acute Bacterial Sinusitis.  This is an infection caused by bacteria and is treated with antibiotics. I have prescribed Augmentin 875mg /125mg  one tablet twice daily with food, for 7 days.and diflucan. You may use an oral decongestant such as Mucinex D or if you have glaucoma or high blood pressure use plain Mucinex. Saline nasal spray help and can safely be used as often as needed for congestion.  If you develop worsening sinus pain, fever or notice severe headache and vision changes, or if symptoms are not better after completion of antibiotic, please schedule an appointment with a health care provider.    Sinus infections are not as easily transmitted as other respiratory infection, however we still recommend that you avoid close contact with loved ones, especially the very young and elderly.  Remember to wash your hands thoroughly throughout the day as this is the number one way to prevent the spread of infection!  Home Care:  Only take medications as instructed by your medical team.  Complete the entire course of an antibiotic.  Do not take these medications with alcohol.  A steam or ultrasonic humidifier can help congestion.  You can place a towel over your head and breathe in the steam from hot water coming from a faucet.  Avoid close contacts especially the very young and the elderly.  Cover your mouth when you cough or sneeze.  Always remember to wash your hands.  Get Help Right Away If:  You develop worsening fever or sinus pain.  You develop a severe head ache or visual changes.  Your symptoms persist after you have completed your treatment plan.  Make sure you  Understand these  instructions.  Will watch your condition.  Will get help right away if you are not doing well or get worse.  Your e-visit answers were reviewed by a board certified advanced clinical practitioner to complete your personal care plan.  Depending on the condition, your plan could have included both over the counter or prescription medications.  If there is a problem please reply  once you have received a response from your provider.  Your safety is important to .  If you have drug allergies check your prescription carefully.    You can use MyChart to ask questions about today's visit, request a non-urgent call back, or ask for a work or school excuse for 24 hours related to this e-Visit. If it has been greater than 24 hours you will need to follow up with your provider, or enter a new e-Visit to address those concerns.  You will get an e-mail in the next two days asking about your experience.  I hope that your e-visit has been valuable and will speed your recovery. Thank you for using e-visits.  5-10 minutes spent reviewing and documenting in chart.

## 2020-05-08 ENCOUNTER — Telehealth: Payer: Self-pay | Admitting: Family Medicine

## 2020-05-08 NOTE — Telephone Encounter (Signed)
Please check with patient to see when she was going to get her labs done.  I do not see where they have come in yet.  Thanks.

## 2020-05-09 NOTE — Telephone Encounter (Signed)
No answer, VM is full and cannot accept any messages.  Will try again later.

## 2020-05-10 ENCOUNTER — Encounter: Payer: Self-pay | Admitting: Family Medicine

## 2020-05-10 NOTE — Telephone Encounter (Signed)
No answer, VM is full and cannot accept new messages.  Letter mailed.

## 2020-06-12 ENCOUNTER — Ambulatory Visit: Payer: No Typology Code available for payment source | Attending: Internal Medicine

## 2020-06-12 ENCOUNTER — Other Ambulatory Visit: Payer: Self-pay

## 2020-06-12 DIAGNOSIS — Z23 Encounter for immunization: Secondary | ICD-10-CM

## 2020-06-12 NOTE — Progress Notes (Signed)
   Covid-19 Vaccination Clinic  Name:  Natasha Welch    MRN: [id number] DOB: 01-Feb-1981  06/12/2020  [name] was observed post Covid-19 immunization for 15 minutes without incident. She was provided with Vaccine Information Sheet and instruction to access the V-Safe system.   [name] was instructed to call 911 with any severe reactions post vaccine: Marland Kitchen Difficulty breathing  . Swelling of face and throat  . A fast heartbeat  . A bad rash all over body  . Dizziness and weakness

## 2020-06-22 ENCOUNTER — Other Ambulatory Visit: Payer: Self-pay | Admitting: Family Medicine

## 2020-06-22 MED ORDER — AMPHETAMINE-DEXTROAMPHETAMINE 15 MG PO TABS: 1.0000 | ORAL_TABLET | Freq: Two times a day (BID) | ORAL | 0 refills | Status: DC

## 2020-06-22 MED ORDER — AMPHETAMINE-DEXTROAMPHETAMINE 15 MG PO TABS: 15.0000 mg | ORAL_TABLET | Freq: Two times a day (BID) | ORAL | 0 refills | Status: DC

## 2020-06-22 NOTE — Telephone Encounter (Signed)
Last office visit 03/26/2020 for CPE.  Last refilled 03/16/2020 for #60 with no refills x 3 months.  UDS/Contract 06/10/2014.  No future appointments.

## 2020-06-22 NOTE — Telephone Encounter (Signed)
Sent. Thanks.  Okay to continue. 

## 2020-07-04 ENCOUNTER — Other Ambulatory Visit: Payer: Self-pay | Admitting: Physical Medicine and Rehabilitation

## 2020-07-25 ENCOUNTER — Encounter: Payer: Self-pay | Admitting: Family Medicine

## 2020-07-25 ENCOUNTER — Other Ambulatory Visit: Payer: Self-pay | Admitting: Family Medicine

## 2020-07-25 ENCOUNTER — Telehealth: Payer: No Typology Code available for payment source | Admitting: Emergency Medicine

## 2020-07-25 ENCOUNTER — Other Ambulatory Visit: Payer: Self-pay | Admitting: Emergency Medicine

## 2020-07-25 DIAGNOSIS — K0889 Other specified disorders of teeth and supporting structures: Secondary | ICD-10-CM | POA: Diagnosis not present

## 2020-07-25 MED ORDER — AMOXICILLIN-POT CLAVULANATE 875-125 MG PO TABS: 1.0000 | ORAL_TABLET | Freq: Two times a day (BID) | ORAL | 0 refills | Status: DC

## 2020-07-25 NOTE — Telephone Encounter (Signed)
Refill request Alprazolam Last refill 02/21/20 #30/1 Last office visit 03/29/20

## 2020-07-25 NOTE — Progress Notes (Signed)
E-Visit for Dental Pain  We are sorry that you are not feeling well.  Here is how we plan to help!  Based on what you have shared with me in the questionnaire, it sounds like you have a developing infection.  I've called in Augmentin.  Take as directed.  It is imperative that you see a dentist within 10 days of this eVisit to determine the cause of the dental pain and be sure it is adequately treated  A toothache or tooth pain is caused when the nerve in the root of a tooth or surrounding a tooth is irritated. Dental (tooth) infection, decay, injury, or loss of a tooth are the most common causes of dental pain. Pain may also occur after an extraction (tooth is pulled out). Pain sometimes originates from other areas and radiates to the jaw, thus appearing to be tooth pain.Bacteria growing inside your mouth can contribute to gum disease and dental decay, both of which can cause pain. A toothache occurs from inflammation of the central portion of the tooth called pulp. The pulp contains nerve endings that are very sensitive to pain. Inflammation to the pulp or pulpitis may be caused by dental cavities, trauma, and infection.    HOME CARE:   For toothaches: . Over-the-counter pain medications such as acetaminophen or ibuprofen may be used. Take these as directed on the package while you arrange for a dental appointment. . Avoid very cold or hot foods, because they may make the pain worse. . You may get relief from biting on a cotton ball soaked in oil of cloves. You can get oil of cloves at most drug stores.  For jaw pain: .  Aspirin may be helpful for problems in the joint of the jaw in adults. . If pain happens every time you open your mouth widely, the temporomandibular joint (TMJ) may be the source of the pain. Yawning or taking a large bite of food may worsen the pain. An appointment with your doctor or dentist will help you find the cause.     GET HELP RIGHT AWAY IF:  . You have a high  fever or chills . If you have had a recent head or face injury and develop headache, light headedness, nausea, vomiting, or other symptoms that concern you after an injury to your face or mouth, you could have a more serious injury in addition to your dental injury. . A facial rash associated with a toothache: This condition may improve with medication. Contact your doctor for them to decide what is appropriate. . Any jaw pain occurring with chest pain: Although jaw pain is most commonly caused by dental disease, it is sometimes referred pain from other areas. People with heart disease, especially people who have had stents placed, people with diabetes, or those who have had heart surgery may have jaw pain as a symptom of heart attack or angina. If your jaw or tooth pain is associated with lightheadedness, sweating, or shortness of breath, you should see a doctor as soon as possible. . Trouble swallowing or excessive pain or bleeding from gums: If you have a history of a weakened immune system, diabetes, or steroid use, you may be more susceptible to infections. Infections can often be more severe and extensive or caused by unusual organisms. Dental and gum infections in people with these conditions may require more aggressive treatment. An abscess may need draining or IV antibiotics, for example.  MAKE SURE YOU    Understand these instructions.  Will watch your condition.  Will get help right away if you are not doing well or get worse.  Thank you for choosing an e-visit. Your e-visit answers were reviewed by a board certified advanced clinical practitioner to complete your personal care plan. Depending upon the condition, your plan could have included both over the counter or prescription medications. Please review your pharmacy choice. Make sure the pharmacy is open so you can pick up prescription now. If there is a problem, you may contact your provider through [employer] and have the  prescription routed to another pharmacy. Your safety is important to [place]. If you have drug allergies check your prescription carefully.  For the next 24 hours you can use MyChart to ask questions about today's visit, request a non-urgent call back, or ask for a work or school excuse. You will get an email in the next two days asking about your experience. I hope that your e-visit has been valuable and will speed your recovery.   Approximately 5 minutes was used in reviewing the patient's chart, questionnaire, prescribing medications, and documentation.

## 2020-07-26 ENCOUNTER — Other Ambulatory Visit: Payer: Self-pay | Admitting: Chiropractic Medicine

## 2020-07-26 NOTE — Telephone Encounter (Signed)
Left detailed message on voicemail.  

## 2020-07-26 NOTE — Telephone Encounter (Signed)
Sent. Thanks.  Did she ever get her labs collected?

## 2020-08-03 ENCOUNTER — Other Ambulatory Visit: Payer: Self-pay | Admitting: Physical Medicine and Rehabilitation

## 2020-08-11 ENCOUNTER — Telehealth: Payer: No Typology Code available for payment source | Admitting: Physician Assistant

## 2020-08-11 ENCOUNTER — Other Ambulatory Visit: Payer: Self-pay | Admitting: Physician Assistant

## 2020-08-11 DIAGNOSIS — R399 Unspecified symptoms and signs involving the genitourinary system: Secondary | ICD-10-CM

## 2020-08-11 MED ORDER — NITROFURANTOIN MONOHYD MACRO 100 MG PO CAPS: 100.0000 mg | ORAL_CAPSULE | Freq: Two times a day (BID) | ORAL | 0 refills | Status: DC

## 2020-08-11 NOTE — Progress Notes (Signed)

## 2020-09-02 ENCOUNTER — Other Ambulatory Visit: Payer: Self-pay | Admitting: Physical Medicine and Rehabilitation

## 2020-09-15 ENCOUNTER — Telehealth: Payer: No Typology Code available for payment source | Admitting: Emergency Medicine

## 2020-09-15 ENCOUNTER — Other Ambulatory Visit: Payer: Self-pay | Admitting: Emergency Medicine

## 2020-09-15 DIAGNOSIS — R300 Dysuria: Secondary | ICD-10-CM

## 2020-09-15 MED ORDER — SULFAMETHOXAZOLE-TRIMETHOPRIM 800-160 MG PO TABS: 1.0000 | ORAL_TABLET | Freq: Two times a day (BID) | ORAL | 0 refills | Status: DC

## 2020-09-15 NOTE — Progress Notes (Signed)
We are sorry that you are not feeling well.  Here is how we plan to help!  Based on what you shared with me it looks like you most likely have a simple urinary tract infection.  A UTI (Urinary Tract Infection) is a bacterial infection of the bladder.  Most cases of urinary tract infections are simple to treat but a key part of your care is to encourage you to drink plenty of fluids and watch your symptoms carefully.  I have prescribed Bactrim DS One tablet twice a day for 5 days.  Your symptoms should gradually improve. Call us if the burning in your urine worsens, you develop worsening fever, back pain or pelvic pain or if your symptoms do not resolve after completing the antibiotic.  Urinary tract infections can be prevented by drinking plenty of water to keep your body hydrated.  Also be sure when you wipe, wipe from front to back and don't hold it in!  If possible, empty your bladder every 4 hours.  Your e-visit answers were reviewed by a board certified advanced clinical practitioner to complete your personal care plan.  Depending on the condition, your plan could have included both over the counter or prescription medications.  If there is a problem please reply  once you have received a response from your provider.  Your safety is important to [place].  If you have drug allergies check your prescription carefully.    You can use MyChart to ask questions about today's visit, request a non-urgent call back, or ask for a work or school excuse for 24 hours related to this e-Visit. If it has been greater than 24 hours you will need to follow up with your provider, or enter a new e-Visit to address those concerns.   You will get an e-mail in the next two days asking about your experience.  I hope that your e-visit has been valuable and will speed your recovery. Thank you for using e-visits.   Approximately 5 minutes was used in reviewing the patient's chart, questionnaire, prescribing  medications, and documentation.

## 2020-10-03 ENCOUNTER — Other Ambulatory Visit: Payer: Self-pay | Admitting: Physical Medicine and Rehabilitation

## 2020-10-07 ENCOUNTER — Encounter: Payer: Self-pay | Admitting: Family Medicine

## 2020-10-07 ENCOUNTER — Other Ambulatory Visit: Payer: Self-pay | Admitting: Family Medicine

## 2020-10-10 ENCOUNTER — Other Ambulatory Visit: Payer: Self-pay | Admitting: Family Medicine

## 2020-10-10 MED ORDER — AMPHETAMINE-DEXTROAMPHETAMINE 15 MG PO TABS
15.0000 mg | ORAL_TABLET | Freq: Two times a day (BID) | ORAL | 0 refills | Status: DC
Start: 2020-10-10 — End: 2020-10-10

## 2020-10-10 MED ORDER — AMPHETAMINE-DEXTROAMPHETAMINE 15 MG PO TABS
15.0000 mg | ORAL_TABLET | Freq: Two times a day (BID) | ORAL | 0 refills | Status: DC
Start: 2020-10-10 — End: 2021-01-23

## 2020-10-10 MED ORDER — AMPHETAMINE-DEXTROAMPHETAMINE 15 MG PO TABS
1.0000 | ORAL_TABLET | Freq: Two times a day (BID) | ORAL | 0 refills | Status: DC
Start: 2020-10-10 — End: 2020-10-10

## 2020-10-10 NOTE — Telephone Encounter (Signed)
Sent. Thanks.   

## 2020-10-12 ENCOUNTER — Other Ambulatory Visit: Payer: Self-pay | Admitting: Family Medicine

## 2020-10-12 DIAGNOSIS — R5383 Other fatigue: Secondary | ICD-10-CM

## 2020-10-12 DIAGNOSIS — Z13220 Encounter for screening for lipoid disorders: Secondary | ICD-10-CM

## 2020-10-13 ENCOUNTER — Other Ambulatory Visit (INDEPENDENT_AMBULATORY_CARE_PROVIDER_SITE_OTHER): Payer: No Typology Code available for payment source

## 2020-10-13 ENCOUNTER — Other Ambulatory Visit: Payer: Self-pay

## 2020-10-13 DIAGNOSIS — R5383 Other fatigue: Secondary | ICD-10-CM

## 2020-10-13 DIAGNOSIS — Z13220 Encounter for screening for lipoid disorders: Secondary | ICD-10-CM | POA: Diagnosis not present

## 2020-10-13 LAB — CBC WITH DIFFERENTIAL/PLATELET
Basophils Absolute: 0 10*3/uL (ref 0.0–0.1)
Basophils Relative: 0.6 % (ref 0.0–3.0)
Eosinophils Absolute: 0.5 10*3/uL (ref 0.0–0.7)
Eosinophils Relative: 6.8 % — ABNORMAL HIGH (ref 0.0–5.0)
HCT: 42.8 % (ref 36.0–46.0)
Hemoglobin: 14.4 g/dL (ref 12.0–15.0)
Lymphocytes Relative: 36 % (ref 12.0–46.0)
Lymphs Abs: 2.6 10*3/uL (ref 0.7–4.0)
MCHC: 33.7 g/dL (ref 30.0–36.0)
MCV: 87.7 fl (ref 78.0–100.0)
Monocytes Absolute: 0.6 10*3/uL (ref 0.1–1.0)
Monocytes Relative: 8.4 % (ref 3.0–12.0)
Neutro Abs: 3.4 10*3/uL (ref 1.4–7.7)
Neutrophils Relative %: 48.2 % (ref 43.0–77.0)
Platelets: 260 10*3/uL (ref 150.0–400.0)
RBC: 4.88 Mil/uL (ref 3.87–5.11)
RDW: 13.8 % (ref 11.5–15.5)
WBC: 7.2 10*3/uL (ref 4.0–10.5)

## 2020-10-13 LAB — COMPREHENSIVE METABOLIC PANEL
ALT: 14 U/L (ref 0–35)
AST: 15 U/L (ref 0–37)
Albumin: 4.3 g/dL (ref 3.5–5.2)
Alkaline Phosphatase: 53 U/L (ref 39–117)
BUN: 12 mg/dL (ref 6–23)
CO2: 27 mEq/L (ref 19–32)
Calcium: 8.8 mg/dL (ref 8.4–10.5)
Chloride: 103 mEq/L (ref 96–112)
Creatinine, Ser: 0.82 mg/dL (ref 0.40–1.20)
GFR: 88.99 mL/min (ref >60.00)
Glucose, Bld: 95 mg/dL (ref 70–99)
Potassium: 4.1 mEq/L (ref 3.5–5.1)
Sodium: 136 mEq/L (ref 135–145)
Total Bilirubin: 0.5 mg/dL (ref 0.2–1.2)
Total Protein: 6.8 g/dL (ref 6.0–8.3)

## 2020-10-13 LAB — VITAMIN B12: Vitamin B-12: 185 pg/mL — ABNORMAL LOW (ref 211–911)

## 2020-10-13 LAB — LIPID PANEL
Cholesterol: 187 mg/dL (ref 0–200)
HDL: 42.3 mg/dL (ref >39.00)
LDL Cholesterol: 130 mg/dL — ABNORMAL HIGH (ref 0–99)
NonHDL: 144.28
Total CHOL/HDL Ratio: 4
Triglycerides: 71 mg/dL (ref 0.0–149.0)
VLDL: 14.2 mg/dL (ref 0.0–40.0)

## 2020-10-13 LAB — TSH: TSH: 2.5 u[IU]/mL (ref 0.35–4.50)

## 2020-10-14 ENCOUNTER — Encounter: Payer: Self-pay | Admitting: Family Medicine

## 2020-10-14 ENCOUNTER — Other Ambulatory Visit: Payer: Self-pay | Admitting: Family Medicine

## 2020-10-14 DIAGNOSIS — E538 Deficiency of other specified B group vitamins: Secondary | ICD-10-CM | POA: Insufficient documentation

## 2020-10-14 MED ORDER — CYANOCOBALAMIN 1000 MCG/ML IJ SOLN: INTRAMUSCULAR | Status: DC

## 2020-10-18 ENCOUNTER — Encounter: Payer: Self-pay | Admitting: Family Medicine

## 2020-10-19 ENCOUNTER — Telehealth: Payer: Self-pay | Admitting: Family Medicine

## 2020-10-19 ENCOUNTER — Other Ambulatory Visit: Payer: Self-pay | Admitting: Family Medicine

## 2020-10-19 MED ORDER — CYANOCOBALAMIN 1000 MCG/ML IJ SOLN
INTRAMUSCULAR | 3 refills | Status: DC
Start: 2020-10-19 — End: 2020-10-19

## 2020-10-19 MED ORDER — "BD INSULIN SYRINGE 25G X 1"" 1 ML MISC": 3 refills | Status: DC

## 2020-10-19 NOTE — Addendum Note (Signed)
Addended by: [name] on: 10/19/2020 11:11 PM   Modules accepted: Orders

## 2020-10-19 NOTE — Telephone Encounter (Signed)
Please update patient re: results.  I sent mychart message but at this point it is listed as unread.  Thanks.  ===================== Your labs are fine except for a low B12 level. This could explain your fatigue and I think it makes sense to get treated. I would start taking B12 injections, weekly initially and then monthly thereafter. You could do the injections at home, if you are comfortable with that. If so, let me know and I can send the prescription to the pharmacy. Otherwise if you want to get the injections done here at the clinic, then please call to schedule a nurse visit. I think it makes sense to recheck a B12 level at a lab visit in about 4 months.  Please let me know which way you want to go with the injections. Take care.

## 2020-10-19 NOTE — Telephone Encounter (Signed)
Spoke with patient and she states she read her results online and sent a mychart message yesterday that she would like to do the B12 injections. She can do them herself and wants rx sent to [facility] pharmacy.

## 2020-10-19 NOTE — Telephone Encounter (Signed)
I sent the prescription  Thanks

## 2020-10-20 NOTE — Telephone Encounter (Signed)
Patient aware rx was sent to pharmacy. 

## 2020-11-02 ENCOUNTER — Other Ambulatory Visit: Payer: Self-pay | Admitting: Physical Medicine and Rehabilitation

## 2020-11-03 ENCOUNTER — Other Ambulatory Visit: Payer: Self-pay | Admitting: Family Medicine

## 2020-11-03 DIAGNOSIS — M542 Cervicalgia: Secondary | ICD-10-CM

## 2020-11-04 ENCOUNTER — Other Ambulatory Visit: Payer: Self-pay | Admitting: Family Medicine

## 2020-12-02 ENCOUNTER — Telehealth: Payer: No Typology Code available for payment source | Admitting: Physician Assistant

## 2020-12-02 ENCOUNTER — Other Ambulatory Visit: Payer: Self-pay | Admitting: Physical Medicine and Rehabilitation

## 2020-12-02 ENCOUNTER — Other Ambulatory Visit: Payer: Self-pay | Admitting: Family Medicine

## 2020-12-02 DIAGNOSIS — R300 Dysuria: Secondary | ICD-10-CM | POA: Diagnosis not present

## 2020-12-02 MED ORDER — CEPHALEXIN 500 MG PO CAPS: 500.0000 mg | ORAL_CAPSULE | Freq: Two times a day (BID) | ORAL | 0 refills | Status: AC

## 2020-12-02 NOTE — Progress Notes (Signed)
Message sent to patient requesting further input regarding current symptoms. Awaiting patient response.  

## 2020-12-02 NOTE — Progress Notes (Signed)

## 2020-12-02 NOTE — Telephone Encounter (Signed)
Refill request for Alprazolam 0.5 mg tablets  LOV - 03/29/20 Next OV - not scheduled Last refilled - 07/26/20 #30/1

## 2020-12-02 NOTE — Progress Notes (Signed)
I have spent 5 minutes in review of e-visit questionnaire, review and updating patient chart, medical decision making and response to patient.   Kynsley Whitehouse Cody Nillie Bartolotta, PA-C    

## 2020-12-03 ENCOUNTER — Other Ambulatory Visit: Payer: Self-pay | Admitting: Family Medicine

## 2020-12-30 ENCOUNTER — Other Ambulatory Visit: Payer: Self-pay | Admitting: Physical Medicine and Rehabilitation

## 2021-01-03 ENCOUNTER — Other Ambulatory Visit: Payer: Self-pay

## 2021-01-03 MED FILL — Venlafaxine HCl Cap ER 24HR 150 MG (Base Equivalent): ORAL | 90 days supply | Qty: 90 | Fill #0 | Status: AC

## 2021-01-03 MED FILL — Etodolac Cap 300 MG: ORAL | 30 days supply | Qty: 60 | Fill #0 | Status: AC

## 2021-01-04 ENCOUNTER — Other Ambulatory Visit: Payer: Self-pay | Admitting: Family Medicine

## 2021-01-04 ENCOUNTER — Other Ambulatory Visit: Payer: Self-pay

## 2021-01-04 MED ORDER — LEVOCETIRIZINE DIHYDROCHLORIDE 5 MG PO TABS
5.0000 mg | ORAL_TABLET | Freq: Every evening | ORAL | 0 refills | Status: DC
  Filled 2021-01-04: qty 90, 90d supply, fill #0

## 2021-01-23 ENCOUNTER — Other Ambulatory Visit: Payer: Self-pay | Admitting: Family Medicine

## 2021-01-23 ENCOUNTER — Encounter: Payer: Self-pay | Admitting: Family Medicine

## 2021-01-23 NOTE — Telephone Encounter (Signed)
Refill request Adderall Last refill 12/10/20 #60 Last office visit 03/29/20 No upcoming appointment

## 2021-01-24 ENCOUNTER — Other Ambulatory Visit: Payer: Self-pay

## 2021-01-24 ENCOUNTER — Other Ambulatory Visit: Payer: Self-pay | Admitting: Family Medicine

## 2021-01-24 DIAGNOSIS — M549 Dorsalgia, unspecified: Secondary | ICD-10-CM

## 2021-01-24 DIAGNOSIS — G8929 Other chronic pain: Secondary | ICD-10-CM

## 2021-01-24 DIAGNOSIS — R5383 Other fatigue: Secondary | ICD-10-CM

## 2021-01-24 MED ORDER — AMPHETAMINE-DEXTROAMPHETAMINE 15 MG PO TABS
1.0000 | ORAL_TABLET | Freq: Two times a day (BID) | ORAL | 0 refills | Status: DC
  Filled 2021-01-24: qty 60, 30d supply, fill #0

## 2021-01-24 MED ORDER — AMPHETAMINE-DEXTROAMPHETAMINE 15 MG PO TABS
15.0000 mg | ORAL_TABLET | Freq: Two times a day (BID) | ORAL | 0 refills | Status: DC
  Filled 2021-01-24 – 2021-03-31 (×2): qty 60, 30d supply, fill #0

## 2021-01-24 MED ORDER — AMPHETAMINE-DEXTROAMPHETAMINE 15 MG PO TABS
15.0000 mg | ORAL_TABLET | Freq: Two times a day (BID) | ORAL | 0 refills | Status: DC
  Filled 2021-01-24 – 2021-03-02 (×2): qty 60, 30d supply, fill #0

## 2021-01-24 NOTE — Telephone Encounter (Signed)
Sent. Thanks.   

## 2021-01-30 ENCOUNTER — Other Ambulatory Visit: Payer: Self-pay

## 2021-01-31 ENCOUNTER — Other Ambulatory Visit: Payer: Self-pay

## 2021-01-31 MED ORDER — MORPHINE SULFATE 15 MG PO TABS
ORAL_TABLET | ORAL | 0 refills | Status: DC
  Filled 2021-01-31: qty 120, 30d supply, fill #0

## 2021-03-02 ENCOUNTER — Other Ambulatory Visit: Payer: Self-pay

## 2021-03-02 MED ORDER — MORPHINE SULFATE 15 MG PO TABS
ORAL_TABLET | ORAL | 0 refills | Status: DC
  Filled 2021-03-02: qty 120, 30d supply, fill #0

## 2021-03-02 MED FILL — Etodolac Cap 300 MG: ORAL | 30 days supply | Qty: 60 | Fill #1 | Status: AC

## 2021-03-02 MED FILL — Alprazolam Tab 0.5 MG: ORAL | 15 days supply | Qty: 30 | Fill #0 | Status: AC

## 2021-03-23 ENCOUNTER — Other Ambulatory Visit: Payer: No Typology Code available for payment source

## 2021-03-30 ENCOUNTER — Other Ambulatory Visit: Payer: Self-pay

## 2021-03-30 ENCOUNTER — Ambulatory Visit (INDEPENDENT_AMBULATORY_CARE_PROVIDER_SITE_OTHER): Payer: No Typology Code available for payment source | Admitting: Family Medicine

## 2021-03-30 ENCOUNTER — Encounter: Payer: Self-pay | Admitting: Family Medicine

## 2021-03-30 VITALS — BP 112/72 | HR 93 | Temp 96.8°F | Ht 64.0 in | Wt 227.0 lb

## 2021-03-30 DIAGNOSIS — Z0000 Encounter for general adult medical examination without abnormal findings: Secondary | ICD-10-CM

## 2021-03-30 DIAGNOSIS — Z7189 Other specified counseling: Secondary | ICD-10-CM

## 2021-03-30 DIAGNOSIS — M549 Dorsalgia, unspecified: Secondary | ICD-10-CM

## 2021-03-30 DIAGNOSIS — E538 Deficiency of other specified B group vitamins: Secondary | ICD-10-CM

## 2021-03-30 DIAGNOSIS — F988 Other specified behavioral and emotional disorders with onset usually occurring in childhood and adolescence: Secondary | ICD-10-CM

## 2021-03-30 DIAGNOSIS — G8929 Other chronic pain: Secondary | ICD-10-CM | POA: Diagnosis not present

## 2021-03-30 DIAGNOSIS — F4320 Adjustment disorder, unspecified: Secondary | ICD-10-CM

## 2021-03-30 DIAGNOSIS — R5383 Other fatigue: Secondary | ICD-10-CM

## 2021-03-30 NOTE — Patient Instructions (Signed)
Don't change your meds for now. Go to the lab on the way out.   If you have mychart we'll likely use that to update you.    Take care.  Glad to see you. 

## 2021-03-30 NOTE — Progress Notes (Signed)
This visit occurred during the SARS-CoV-2 public health emergency.  Safety protocols were in place, including screening questions prior to the visit, additional usage of staff PPE, and extensive cleaning of exam room while observing appropriate contact time as indicated for disinfecting solutions.  CPE- See plan.  Routine anticipatory guidance given to patient.  See health maintenance.  The possibility exists that previously documented standard health maintenance information may have been brought forward from a previous encounter into this note.  If needed, that same information has been updated to reflect the current situation based on today's encounter.    Pap and DXA not due. Mammogram d/w pt.  Encouraged.   Colon cancer screening not due tdap 2015 covid vaccine 2021 Flu shot done at work yearly PNA and shingles not due Living will d/w pt- she would have her mother designated if patient were incapacitated. Diet and exercise d/w pt. HIV screen neg ~2002 per patient.   HCV prev done with prenatal labs.    Mood/ADD d/w pt.  She is working to stay on schedule with venlafaxine.  She thought that med helped.  No SI/HI.  Still using adderall at baseline.  That helps some with concentration.  No ADE on med.    B12 now dosed monthly.  Had been on weekly tx and she felt better with that.  Fatigue noted o/w.  Last B12 dose was 4 weeks ago.  She also found out about a B12 patch, but hasn't been using it in the meantime.    Vit D level pending.  See notes on labs.    H/o chronic back pain.  D/w pt about current med use with f/u UDS pending.    PMH and SH reviewed  Meds, vitals, and allergies reviewed.   ROS: Per HPI.  Unless specifically indicated otherwise in HPI, the patient denies:  General: fever. Eyes: acute vision changes ENT: sore throat Cardiovascular: chest pain Respiratory: SOB GI: vomiting GU: dysuria Musculoskeletal: acute back pain Derm: acute rash Neuro: acute motor  dysfunction Psych: worsening mood Endocrine: polydipsia Heme: bleeding Allergy: hayfever  GEN: nad, alert and oriented HEENT: ncat NECK: supple w/o LA CV: rrr. PULM: ctab, no inc wob ABD: soft, +bs EXT: no edema SKIN: no acute rash

## 2021-03-31 ENCOUNTER — Other Ambulatory Visit: Payer: Self-pay

## 2021-03-31 LAB — VITAMIN D 25 HYDROXY (VIT D DEFICIENCY, FRACTURES): VITD: 13.89 ng/mL — ABNORMAL LOW (ref 30.00–100.00)

## 2021-03-31 LAB — VITAMIN B12: Vitamin B-12: 512 pg/mL (ref 211–911)

## 2021-03-31 MED ORDER — MORPHINE SULFATE 15 MG PO TABS
ORAL_TABLET | ORAL | 0 refills | Status: DC
  Filled 2021-03-31: qty 120, 30d supply, fill #0

## 2021-03-31 MED ORDER — MORPHINE SULFATE 15 MG PO TABS
ORAL_TABLET | ORAL | 0 refills | Status: DC
  Filled 2021-03-31 – 2021-05-01 (×2): qty 120, 30d supply, fill #0

## 2021-03-31 MED FILL — Cyanocobalamin Inj 1000 MCG/ML: INTRAMUSCULAR | 28 days supply | Qty: 4 | Fill #0 | Status: AC

## 2021-04-04 ENCOUNTER — Encounter: Payer: Self-pay | Admitting: Family Medicine

## 2021-04-04 ENCOUNTER — Other Ambulatory Visit: Payer: Self-pay | Admitting: Family Medicine

## 2021-04-04 ENCOUNTER — Other Ambulatory Visit: Payer: Self-pay

## 2021-04-04 DIAGNOSIS — E559 Vitamin D deficiency, unspecified: Secondary | ICD-10-CM | POA: Insufficient documentation

## 2021-04-04 MED ORDER — VITAMIN D (ERGOCALCIFEROL) 1.25 MG (50000 UNIT) PO CAPS
50000.0000 [IU] | ORAL_CAPSULE | ORAL | 0 refills | Status: DC
  Filled 2021-04-04: qty 12, 84d supply, fill #0

## 2021-04-04 NOTE — Assessment & Plan Note (Signed)
See notes on labs. 

## 2021-04-04 NOTE — Assessment & Plan Note (Signed)
Pap and DXA not due. Mammogram d/w pt.  Encouraged.   Colon cancer screening not due tdap 2015 covid vaccine 2021 Flu shot done at work yearly PNA and shingles not due Living will d/w pt- she would have her mother designated if patient were incapacitated. Diet and exercise d/w pt. HIV screen neg ~2002 per patient.   HCV prev done with prenatal labs.   

## 2021-04-04 NOTE — Assessment & Plan Note (Signed)
Mood/ADD d/w pt.  She is working to stay on schedule with venlafaxine.  She thought that med helped.  No SI/HI.  Still using adderall at baseline.  That helps some with concentration.  No ADE on med.  Would continue as is.   

## 2021-04-04 NOTE — Assessment & Plan Note (Signed)
Mood/ADD d/w pt.  She is working to stay on schedule with venlafaxine.  She thought that med helped.  No SI/HI.  Still using adderall at baseline.  That helps some with concentration.  No ADE on med.  Would continue as is.

## 2021-04-04 NOTE — Assessment & Plan Note (Signed)
Living will d/w pt- she would have her mother designated if patient were incapacitated. 

## 2021-04-06 ENCOUNTER — Telehealth: Payer: No Typology Code available for payment source | Admitting: Physician Assistant

## 2021-04-06 ENCOUNTER — Other Ambulatory Visit: Payer: Self-pay

## 2021-04-06 DIAGNOSIS — J019 Acute sinusitis, unspecified: Secondary | ICD-10-CM | POA: Diagnosis not present

## 2021-04-06 DIAGNOSIS — B9689 Other specified bacterial agents as the cause of diseases classified elsewhere: Secondary | ICD-10-CM

## 2021-04-06 DIAGNOSIS — T3695XA Adverse effect of unspecified systemic antibiotic, initial encounter: Secondary | ICD-10-CM

## 2021-04-06 DIAGNOSIS — B379 Candidiasis, unspecified: Secondary | ICD-10-CM

## 2021-04-06 MED ORDER — AMOXICILLIN-POT CLAVULANATE 875-125 MG PO TABS
1.0000 | ORAL_TABLET | Freq: Two times a day (BID) | ORAL | 0 refills | Status: DC
  Filled 2021-04-06: qty 14, 7d supply, fill #0

## 2021-04-06 MED ORDER — FLUCONAZOLE 150 MG PO TABS
150.0000 mg | ORAL_TABLET | Freq: Once | ORAL | 0 refills | Status: AC
  Filled 2021-04-06: qty 2, 2d supply, fill #0

## 2021-04-06 NOTE — Progress Notes (Signed)
E-Visit for Sinus Problems  We are sorry that you are not feeling well.  Here is how we plan to help!  Based on what you have shared with me it looks like you have sinusitis.  Sinusitis is inflammation and infection in the sinus cavities of the head.  Based on your presentation I believe you most likely have Acute Bacterial Sinusitis.  This is an infection caused by bacteria and is treated with antibiotics. I have prescribed Augmentin 875mg /125mg  one tablet twice daily with food, for 7 days. You may use an oral decongestant such as Mucinex D or if you have glaucoma or high blood pressure use plain Mucinex. Saline nasal spray help and can safely be used as often as needed for congestion.  If you develop worsening sinus pain, fever or notice severe headache and vision changes, or if symptoms are not better after completion of antibiotic, please schedule an appointment with a health care provider.  Diflucan has also been prescribed for antibiotic induced yeast infection.    Sinus infections are not as easily transmitted as other respiratory infection, however we still recommend that you avoid close contact with loved ones, especially the very young and elderly.  Remember to wash your hands thoroughly throughout the day as this is the number one way to prevent the spread of infection!  Home Care: Only take medications as instructed by your medical team. Complete the entire course of an antibiotic. Do not take these medications with alcohol. A steam or ultrasonic humidifier can help congestion.  You can place a towel over your head and breathe in the steam from hot water coming from a faucet. Avoid close contacts especially the very young and the elderly. Cover your mouth when you cough or sneeze. Always remember to wash your hands.  Get Help Right Away If: You develop worsening fever or sinus pain. You develop a severe head ache or visual changes. Your symptoms persist after you have completed your  treatment plan.  Make sure you Understand these instructions. Will watch your condition. Will get help right away if you are not doing well or get worse.  Thank you for choosing an e-visit.  Your e-visit answers were reviewed by a board certified advanced clinical practitioner to complete your personal care plan. Depending upon the condition, your plan could have included both over the counter or prescription medications.  Please review your pharmacy choice. Make sure the pharmacy is open so you can pick up prescription now. If there is a problem, you may contact your provider through and have the prescription routed to another pharmacy.  Your safety is important to [employer]. If you have drug allergies check your prescription carefully.   For the next 24 hours you can use MyChart to ask questions about today's visit, request a non-urgent call back, or ask for a work or school excuse. You will get an email in the next two days asking about your experience. I hope that your e-visit has been valuable and will speed your recovery.  I provided 5 minutes of non face-to-face time during this encounter for chart review and documentation.

## 2021-04-08 LAB — DRUG MONITORING, PANEL 8 WITH CONFIRMATION, URINE
6 Acetylmorphine: NEGATIVE ng/mL (ref <10)
Alcohol Metabolites: NEGATIVE ng/mL (ref <500)
Alphahydroxyalprazolam: 54 ng/mL — ABNORMAL HIGH (ref <25)
Alphahydroxymidazolam: NEGATIVE ng/mL (ref <50)
Alphahydroxytriazolam: NEGATIVE ng/mL (ref <50)
Aminoclonazepam: NEGATIVE ng/mL (ref <25)
Amphetamine: 8183 ng/mL — ABNORMAL HIGH (ref <250)
Amphetamines: POSITIVE ng/mL — AB (ref <500)
Benzodiazepines: POSITIVE ng/mL — AB (ref <100)
Buprenorphine, Urine: NEGATIVE ng/mL (ref <5)
Cocaine Metabolite: NEGATIVE ng/mL (ref <150)
Codeine: NEGATIVE ng/mL (ref <50)
Creatinine: 111.7 mg/dL (ref >=20.0)
Hydrocodone: NEGATIVE ng/mL (ref <50)
Hydromorphone: 68 ng/mL — ABNORMAL HIGH (ref <50)
Hydroxyethylflurazepam: NEGATIVE ng/mL (ref <50)
Lorazepam: NEGATIVE ng/mL (ref <50)
MDMA: NEGATIVE ng/mL (ref <500)
Marijuana Metabolite: NEGATIVE ng/mL (ref <20)
Methamphetamine: NEGATIVE ng/mL (ref <250)
Morphine: >10000 ng/mL — ABNORMAL HIGH (ref <50)
Nordiazepam: NEGATIVE ng/mL (ref <50)
Norhydrocodone: NEGATIVE ng/mL (ref <50)
Opiates: POSITIVE ng/mL — AB (ref <100)
Oxazepam: NEGATIVE ng/mL (ref <50)
Oxidant: NEGATIVE ug/mL (ref <200)
Oxycodone: NEGATIVE ng/mL (ref <100)
Temazepam: NEGATIVE ng/mL (ref <50)
pH: 5.8 (ref 4.5–9.0)

## 2021-04-08 LAB — DM TEMPLATE

## 2021-04-26 ENCOUNTER — Other Ambulatory Visit: Payer: Self-pay | Admitting: Family Medicine

## 2021-04-26 ENCOUNTER — Other Ambulatory Visit: Payer: Self-pay

## 2021-04-26 MED FILL — Etodolac Cap 300 MG: ORAL | 30 days supply | Qty: 60 | Fill #2 | Status: AC

## 2021-04-27 ENCOUNTER — Other Ambulatory Visit: Payer: Self-pay

## 2021-04-27 ENCOUNTER — Other Ambulatory Visit: Payer: Self-pay | Admitting: Family Medicine

## 2021-04-27 MED ORDER — LEVOCETIRIZINE DIHYDROCHLORIDE 5 MG PO TABS
5.0000 mg | ORAL_TABLET | Freq: Every evening | ORAL | 0 refills | Status: DC
  Filled 2021-04-27: qty 90, 90d supply, fill #0

## 2021-04-27 NOTE — Telephone Encounter (Signed)
Refill request for Alprazolam 0.5 mg tablets  LOV - 03/30/21 Next OV - not scheduled Last refill - 12/03/20 #30/1

## 2021-04-28 ENCOUNTER — Other Ambulatory Visit: Payer: Self-pay

## 2021-04-28 MED ORDER — ALPRAZOLAM 0.5 MG PO TABS
ORAL_TABLET | ORAL | 1 refills | Status: DC
  Filled 2021-04-28: qty 30, 15d supply, fill #0
  Filled 2021-06-28: qty 30, 15d supply, fill #1

## 2021-04-28 MED ORDER — VENLAFAXINE HCL ER 150 MG PO CP24
ORAL_CAPSULE | Freq: Every day | ORAL | 3 refills | Status: DC
  Filled 2021-04-28: qty 90, 90d supply, fill #0
  Filled 2021-08-30: qty 90, 90d supply, fill #1
  Filled 2021-12-01: qty 90, 90d supply, fill #2
  Filled 2022-04-01: qty 90, 90d supply, fill #3

## 2021-05-01 ENCOUNTER — Other Ambulatory Visit: Payer: Self-pay | Admitting: Family Medicine

## 2021-05-01 ENCOUNTER — Other Ambulatory Visit: Payer: Self-pay

## 2021-05-02 ENCOUNTER — Other Ambulatory Visit: Payer: Self-pay

## 2021-05-02 NOTE — Telephone Encounter (Signed)
Refill request for Adderall 15 mg tablets  LOV - 03/30/21 Next OV - not scheduled Last refill - 01/24/21 #60/0

## 2021-05-03 ENCOUNTER — Other Ambulatory Visit: Payer: Self-pay

## 2021-05-03 MED ORDER — AMPHETAMINE-DEXTROAMPHETAMINE 15 MG PO TABS
15.0000 mg | ORAL_TABLET | Freq: Two times a day (BID) | ORAL | 0 refills | Status: DC
  Filled 2021-05-03 – 2021-07-19 (×2): qty 60, 30d supply, fill #0

## 2021-05-03 MED ORDER — AMPHETAMINE-DEXTROAMPHETAMINE 15 MG PO TABS
1.0000 | ORAL_TABLET | Freq: Two times a day (BID) | ORAL | 0 refills | Status: DC
  Filled 2021-05-03: qty 60, 30d supply, fill #0

## 2021-05-03 MED FILL — Amphetamine-Dextroamphetamine Tab 15 MG: ORAL | 30 days supply | Qty: 60 | Fill #0 | Status: CN

## 2021-05-03 NOTE — Telephone Encounter (Signed)
Sent. Thanks.   

## 2021-05-12 ENCOUNTER — Other Ambulatory Visit: Payer: Self-pay

## 2021-05-18 ENCOUNTER — Other Ambulatory Visit: Payer: Self-pay

## 2021-05-18 MED ORDER — PREGABALIN 75 MG PO CAPS
75.0000 mg | ORAL_CAPSULE | Freq: Two times a day (BID) | ORAL | 3 refills | Status: DC
  Filled 2021-05-18: qty 60, 30d supply, fill #0
  Filled 2022-02-16: qty 60, 30d supply, fill #1

## 2021-05-31 ENCOUNTER — Other Ambulatory Visit: Payer: Self-pay

## 2021-05-31 ENCOUNTER — Other Ambulatory Visit: Payer: Self-pay | Admitting: Family Medicine

## 2021-05-31 DIAGNOSIS — M542 Cervicalgia: Secondary | ICD-10-CM

## 2021-05-31 MED ORDER — MORPHINE SULFATE 15 MG PO TABS
ORAL_TABLET | ORAL | 0 refills | Status: DC
  Filled 2021-05-31: qty 120, 30d supply, fill #0

## 2021-05-31 MED FILL — Amphetamine-Dextroamphetamine Tab 15 MG: ORAL | 30 days supply | Qty: 60 | Fill #0 | Status: AC

## 2021-06-01 ENCOUNTER — Other Ambulatory Visit: Payer: Self-pay | Admitting: Family Medicine

## 2021-06-01 ENCOUNTER — Other Ambulatory Visit: Payer: Self-pay

## 2021-06-01 DIAGNOSIS — M542 Cervicalgia: Secondary | ICD-10-CM

## 2021-06-02 ENCOUNTER — Other Ambulatory Visit: Payer: Self-pay

## 2021-06-02 MED FILL — Etodolac Cap 300 MG: ORAL | 30 days supply | Qty: 60 | Fill #0 | Status: AC

## 2021-06-06 ENCOUNTER — Other Ambulatory Visit: Payer: Self-pay

## 2021-06-28 ENCOUNTER — Other Ambulatory Visit: Payer: Self-pay

## 2021-06-29 ENCOUNTER — Other Ambulatory Visit: Payer: Self-pay

## 2021-06-29 MED ORDER — MORPHINE SULFATE 15 MG PO TABS
15.0000 mg | ORAL_TABLET | Freq: Four times a day (QID) | ORAL | 0 refills | Status: DC | PRN
  Filled 2021-06-29 – 2021-06-30 (×3): qty 120, 30d supply, fill #0

## 2021-06-30 ENCOUNTER — Other Ambulatory Visit: Payer: Self-pay

## 2021-07-03 ENCOUNTER — Other Ambulatory Visit: Payer: Self-pay

## 2021-07-19 ENCOUNTER — Other Ambulatory Visit: Payer: Self-pay

## 2021-07-19 MED FILL — Etodolac Cap 300 MG: ORAL | 30 days supply | Qty: 60 | Fill #1 | Status: AC

## 2021-08-02 ENCOUNTER — Other Ambulatory Visit: Payer: Self-pay

## 2021-08-02 MED ORDER — MORPHINE SULFATE 15 MG PO TABS
ORAL_TABLET | ORAL | 0 refills | Status: DC
  Filled 2021-08-02: qty 120, 30d supply, fill #0

## 2021-08-14 ENCOUNTER — Other Ambulatory Visit: Payer: Self-pay | Admitting: Family Medicine

## 2021-08-15 ENCOUNTER — Other Ambulatory Visit (HOSPITAL_COMMUNITY): Payer: Self-pay

## 2021-08-15 ENCOUNTER — Other Ambulatory Visit: Payer: Self-pay | Admitting: Family Medicine

## 2021-08-15 ENCOUNTER — Other Ambulatory Visit: Payer: Self-pay

## 2021-08-15 NOTE — Telephone Encounter (Signed)
Refill request for ALPRAZolam [name]) 0.5 MG tablet  LOV - 03/30/21 Next OV - not scheduled Last refill - 04/28/21 #30/1

## 2021-08-16 ENCOUNTER — Other Ambulatory Visit: Payer: Self-pay

## 2021-08-18 ENCOUNTER — Encounter: Payer: Self-pay | Admitting: Family Medicine

## 2021-08-18 ENCOUNTER — Other Ambulatory Visit: Payer: Self-pay | Admitting: Family Medicine

## 2021-08-18 ENCOUNTER — Other Ambulatory Visit (HOSPITAL_COMMUNITY): Payer: Self-pay

## 2021-08-18 ENCOUNTER — Telehealth: Payer: Self-pay | Admitting: Family Medicine

## 2021-08-18 ENCOUNTER — Other Ambulatory Visit: Payer: Self-pay

## 2021-08-18 MED ORDER — ALPRAZOLAM 0.5 MG PO TABS
ORAL_TABLET | ORAL | 1 refills | Status: DC
Start: 2021-08-18 — End: 2021-08-21
  Filled 2021-08-18: qty 30, fill #0

## 2021-08-18 NOTE — Telephone Encounter (Signed)
Looks like there are multiple refill request for this. Please send in for patient when able to.

## 2021-08-18 NOTE — Telephone Encounter (Signed)
  Encourage patient to contact the pharmacy for refills or they can request refills through [employer]  LAST APPOINTMENT DATE:  Please schedule appointment if longer than 1 year  NEXT APPOINTMENT DATE:No Future Appt  MEDICATION:ALPRAZolam (XANAX) 0.5 MG tablet  Is the patient out of medication?   PHARMACY:[facility]  Let patient know to contact pharmacy at the end of the day to make sure medication is ready.  Please notify patient to allow 48-72 hours to process  CLINICAL FILLS OUT ALL BELOW:   LAST REFILL:  QTY:  REFILL DATE:    OTHER COMMENTS:    Okay for refill?  Please advise

## 2021-08-18 NOTE — Telephone Encounter (Signed)
Sent.  Note sent to patient.  Thanks.

## 2021-08-18 NOTE — Addendum Note (Signed)
Addended by: [name] on: 08/18/2021 05:21 PM   Modules accepted: Orders

## 2021-08-21 ENCOUNTER — Other Ambulatory Visit: Payer: Self-pay | Admitting: Family Medicine

## 2021-08-21 ENCOUNTER — Other Ambulatory Visit (HOSPITAL_COMMUNITY): Payer: Self-pay

## 2021-08-21 ENCOUNTER — Other Ambulatory Visit: Payer: Self-pay

## 2021-08-21 MED ORDER — ALPRAZOLAM 0.5 MG PO TABS
0.2500 mg | ORAL_TABLET | Freq: Two times a day (BID) | ORAL | 1 refills | Status: DC | PRN
  Filled 2021-08-21: qty 30, 15d supply, fill #0
  Filled 2021-10-06: qty 30, 15d supply, fill #1

## 2021-08-29 ENCOUNTER — Other Ambulatory Visit (HOSPITAL_COMMUNITY): Payer: Self-pay

## 2021-08-29 ENCOUNTER — Other Ambulatory Visit: Payer: Self-pay | Admitting: Family Medicine

## 2021-08-29 MED FILL — Etodolac Cap 300 MG: ORAL | 30 days supply | Qty: 60 | Fill #2 | Status: AC

## 2021-08-30 ENCOUNTER — Other Ambulatory Visit (HOSPITAL_COMMUNITY): Payer: Self-pay

## 2021-08-30 MED ORDER — AMPHETAMINE-DEXTROAMPHETAMINE 15 MG PO TABS
1.0000 | ORAL_TABLET | Freq: Two times a day (BID) | ORAL | 0 refills | Status: DC
  Filled 2021-08-30 (×2): qty 60, 30d supply, fill #0

## 2021-08-30 MED ORDER — AMPHETAMINE-DEXTROAMPHETAMINE 15 MG PO TABS
15.0000 mg | ORAL_TABLET | Freq: Two times a day (BID) | ORAL | 0 refills | Status: DC
  Filled 2021-08-30 – 2021-11-28 (×2): qty 60, 30d supply, fill #0

## 2021-08-30 MED ORDER — AMPHETAMINE-DEXTROAMPHETAMINE 15 MG PO TABS
15.0000 mg | ORAL_TABLET | Freq: Two times a day (BID) | ORAL | 0 refills | Status: DC
  Filled 2021-08-30 – 2021-10-06 (×2): qty 60, 30d supply, fill #0

## 2021-08-30 NOTE — Telephone Encounter (Signed)
Sent. Thanks.   

## 2021-08-30 NOTE — Telephone Encounter (Signed)
Refill request for amphetamine-dextroamphetamine (ADDERALL) 15 MG tablet  LOV - 03/30/21 Next OV - not scheduled Last refill - 05/03/21 #60/0 x 3

## 2021-08-31 ENCOUNTER — Other Ambulatory Visit (HOSPITAL_COMMUNITY): Payer: Self-pay

## 2021-08-31 ENCOUNTER — Other Ambulatory Visit: Payer: Self-pay

## 2021-08-31 MED ORDER — MORPHINE SULFATE 15 MG PO TABS
ORAL_TABLET | ORAL | 0 refills | Status: DC
  Filled 2021-08-31: qty 120, 30d supply, fill #0
  Filled 2021-11-03: qty 120, fill #0

## 2021-09-01 ENCOUNTER — Other Ambulatory Visit (HOSPITAL_COMMUNITY): Payer: Self-pay

## 2021-09-01 ENCOUNTER — Other Ambulatory Visit: Payer: Self-pay

## 2021-09-01 MED ORDER — MORPHINE SULFATE 15 MG PO TABS
15.0000 mg | ORAL_TABLET | Freq: Four times a day (QID) | ORAL | 0 refills | Status: DC | PRN
  Filled 2021-09-01 (×2): qty 120, 30d supply, fill #0

## 2021-09-12 ENCOUNTER — Other Ambulatory Visit (HOSPITAL_COMMUNITY): Payer: Self-pay

## 2021-09-12 MED FILL — Cyanocobalamin Inj 1000 MCG/ML: INTRAMUSCULAR | 90 days supply | Qty: 3 | Fill #1 | Status: AC

## 2021-09-18 ENCOUNTER — Telehealth: Payer: No Typology Code available for payment source | Admitting: Physician Assistant

## 2021-09-18 ENCOUNTER — Other Ambulatory Visit (HOSPITAL_COMMUNITY): Payer: Self-pay

## 2021-09-18 DIAGNOSIS — R3989 Other symptoms and signs involving the genitourinary system: Secondary | ICD-10-CM | POA: Diagnosis not present

## 2021-09-18 MED ORDER — SULFAMETHOXAZOLE-TRIMETHOPRIM 800-160 MG PO TABS
1.0000 | ORAL_TABLET | Freq: Two times a day (BID) | ORAL | 0 refills | Status: DC
Start: 2021-09-18 — End: 2022-03-09
  Filled 2021-09-18: qty 10, 5d supply, fill #0

## 2021-09-18 NOTE — Progress Notes (Signed)

## 2021-10-04 ENCOUNTER — Other Ambulatory Visit (HOSPITAL_COMMUNITY): Payer: Self-pay

## 2021-10-04 MED ORDER — MORPHINE SULFATE 15 MG PO TABS
15.0000 mg | ORAL_TABLET | Freq: Four times a day (QID) | ORAL | 0 refills | Status: DC | PRN
  Filled 2021-10-04: qty 120, 30d supply, fill #0

## 2021-10-06 ENCOUNTER — Other Ambulatory Visit (HOSPITAL_COMMUNITY): Payer: Self-pay

## 2021-10-09 ENCOUNTER — Other Ambulatory Visit (HOSPITAL_COMMUNITY): Payer: Self-pay

## 2021-11-03 ENCOUNTER — Other Ambulatory Visit (HOSPITAL_COMMUNITY): Payer: Self-pay

## 2021-11-03 MED ORDER — MORPHINE SULFATE 15 MG PO TABS
15.0000 mg | ORAL_TABLET | Freq: Four times a day (QID) | ORAL | 0 refills | Status: DC | PRN
Start: 2021-11-03 — End: 2021-12-04
  Filled 2021-11-03: qty 120, 30d supply, fill #0

## 2021-11-08 ENCOUNTER — Other Ambulatory Visit (HOSPITAL_COMMUNITY): Payer: Self-pay

## 2021-11-08 MED FILL — Etodolac Cap 300 MG: ORAL | 30 days supply | Qty: 60 | Fill #3 | Status: AC

## 2021-11-09 ENCOUNTER — Other Ambulatory Visit (HOSPITAL_COMMUNITY): Payer: Self-pay

## 2021-11-28 ENCOUNTER — Other Ambulatory Visit (HOSPITAL_COMMUNITY): Payer: Self-pay

## 2021-12-01 ENCOUNTER — Other Ambulatory Visit: Payer: Self-pay | Admitting: Family Medicine

## 2021-12-01 ENCOUNTER — Other Ambulatory Visit (HOSPITAL_COMMUNITY): Payer: Self-pay

## 2021-12-01 NOTE — Telephone Encounter (Signed)
Refill request for ALPRAZolam (XANAX) 0.5 MG tablet ? ?LOV - 03/30/21 ?Next OV - not scheduled ?Last refill - 08/21/21 #30/1 ? ?

## 2021-12-03 MED ORDER — ALPRAZOLAM 0.5 MG PO TABS
0.2500 mg | ORAL_TABLET | Freq: Two times a day (BID) | ORAL | 1 refills | Status: DC | PRN
Start: 2021-12-03 — End: 2022-07-26
  Filled 2021-12-03: qty 30, 15d supply, fill #0
  Filled 2022-02-16: qty 30, 15d supply, fill #1

## 2021-12-03 NOTE — Telephone Encounter (Signed)
Sent. Thanks.   

## 2021-12-04 ENCOUNTER — Other Ambulatory Visit (HOSPITAL_COMMUNITY): Payer: Self-pay

## 2021-12-04 MED ORDER — MORPHINE SULFATE 15 MG PO TABS
15.0000 mg | ORAL_TABLET | Freq: Four times a day (QID) | ORAL | 0 refills | Status: DC | PRN
Start: 2021-12-04 — End: 2022-01-03
  Filled 2021-12-04: qty 120, 30d supply, fill #0

## 2022-01-03 ENCOUNTER — Other Ambulatory Visit (HOSPITAL_COMMUNITY): Payer: Self-pay

## 2022-01-03 MED ORDER — MORPHINE SULFATE 15 MG PO TABS
15.0000 mg | ORAL_TABLET | Freq: Four times a day (QID) | ORAL | 0 refills | Status: DC | PRN
  Filled 2022-01-03: qty 120, 30d supply, fill #0

## 2022-01-08 ENCOUNTER — Other Ambulatory Visit (HOSPITAL_COMMUNITY): Payer: Self-pay

## 2022-01-08 ENCOUNTER — Other Ambulatory Visit: Payer: Self-pay | Admitting: Family Medicine

## 2022-01-08 NOTE — Telephone Encounter (Signed)
Refill request for amphetamine-dextroamphetamine (ADDERALL) 15 MG tablet ? ?LOV - 03/30/21 ?Next OV - not scheduled ?Last refill - 10/29/21 #60/0 ? ?

## 2022-01-09 ENCOUNTER — Telehealth: Payer: No Typology Code available for payment source | Admitting: Physician Assistant

## 2022-01-09 ENCOUNTER — Other Ambulatory Visit (HOSPITAL_COMMUNITY): Payer: Self-pay

## 2022-01-09 DIAGNOSIS — K047 Periapical abscess without sinus: Secondary | ICD-10-CM

## 2022-01-09 MED ORDER — AMOXICILLIN-POT CLAVULANATE 875-125 MG PO TABS
1.0000 | ORAL_TABLET | Freq: Two times a day (BID) | ORAL | 0 refills | Status: DC
  Filled 2022-01-09: qty 20, 10d supply, fill #0

## 2022-01-09 MED ORDER — AMPHETAMINE-DEXTROAMPHETAMINE 15 MG PO TABS
15.0000 mg | ORAL_TABLET | Freq: Two times a day (BID) | ORAL | 0 refills | Status: DC
Start: 2022-01-09 — End: 2022-05-04
  Filled 2022-01-09 – 2022-02-16 (×2): qty 60, 30d supply, fill #0

## 2022-01-09 MED ORDER — AMPHETAMINE-DEXTROAMPHETAMINE 15 MG PO TABS
1.0000 | ORAL_TABLET | Freq: Two times a day (BID) | ORAL | 0 refills | Status: DC
  Filled 2022-01-09: qty 60, 30d supply, fill #0

## 2022-01-09 MED ORDER — AMPHETAMINE-DEXTROAMPHETAMINE 15 MG PO TABS
15.0000 mg | ORAL_TABLET | Freq: Two times a day (BID) | ORAL | 0 refills | Status: DC
  Filled 2022-01-09 – 2022-04-01 (×3): qty 60, 30d supply, fill #0

## 2022-01-09 NOTE — Telephone Encounter (Signed)
Sent. Thanks.   

## 2022-01-09 NOTE — Progress Notes (Signed)
E-Visit for Dental Pain ? ?We are sorry that you are not feeling well.  Here is how we plan to help! ? ?Based on what you have shared with me in the questionnaire, it sounds like you have a dental infection starting at base of broken tooth. Please continue your chronic pain medication. I have sent in an antibiotic for you to take as directed - Augmentin twice daily for 10 days. ? ?It is imperative that you see a dentist within 10 days of this eVisit to determine the cause of the dental pain and be sure it is adequately treated ? ?A toothache or tooth pain is caused when the nerve in the root of a tooth or surrounding a tooth is irritated. Dental (tooth) infection, decay, injury, or loss of a tooth are the most common causes of dental pain. Pain may also occur after an extraction (tooth is pulled out). Pain sometimes originates from other areas and radiates to the jaw, thus appearing to be tooth pain.Bacteria growing inside your mouth can contribute to gum disease and dental decay, both of which can cause pain. A toothache occurs from inflammation of the central portion of the tooth called pulp. The pulp contains nerve endings that are very sensitive to pain. Inflammation to the pulp or pulpitis may be caused by dental cavities, trauma, and infection.  ? ? ?HOME CARE:  ? ?For toothaches: ?Over-the-counter pain medications such as acetaminophen or ibuprofen may be used. Take these as directed on the package while you arrange for a dental appointment. ?Avoid very cold or hot foods, because they may make the pain worse. ?You may get relief from biting on a cotton ball soaked in oil of cloves. You can get oil of cloves at most drug stores. ? ?For jaw pain: ? Aspirin may be helpful for problems in the joint of the jaw in adults. ?If pain happens every time you open your mouth widely, the temporomandibular joint (TMJ) may be the source of the pain. Yawning or taking a large bite of food may worsen the pain. An appointment  with your doctor or dentist will help you find the cause. ?  ? ? ?GET HELP RIGHT AWAY IF: ? ?You have a high fever or chills ?If you have had a recent head or face injury and develop headache, light headedness, nausea, vomiting, or other symptoms that concern you after an injury to your face or mouth, you could have a more serious injury in addition to your dental injury. ?A facial rash associated with a toothache: This condition may improve with medication. Contact your doctor for them to decide what is appropriate. ?Any jaw pain occurring with chest pain: Although jaw pain is most commonly caused by dental disease, it is sometimes referred pain from other areas. People with heart disease, especially people who have had stents placed, people with diabetes, or those who have had heart surgery may have jaw pain as a symptom of heart attack or angina. If your jaw or tooth pain is associated with lightheadedness, sweating, or shortness of breath, you should see a doctor as soon as possible. ?Trouble swallowing or excessive pain or bleeding from gums: If you have a history of a weakened immune system, diabetes, or steroid use, you may be more susceptible to infections. Infections can often be more severe and extensive or caused by unusual organisms. Dental and gum infections in people with these conditions may require more aggressive treatment. An abscess may need draining or IV antibiotics, for  example. ? ?MAKE SURE YOU  ? ?Understand these instructions. ?Will watch your condition. ?Will get help right away if you are not doing well or get worse. ? ?Thank you for choosing an e-visit. ? ?Your e-visit answers were reviewed by a board certified advanced clinical practitioner to complete your personal care plan. Depending upon the condition, your plan could have included both over the counter or prescription medications. ? ?Please review your pharmacy choice. Make sure the pharmacy is open so you can pick up prescription  now. If there is a problem, you may contact your provider through [employer] and have the prescription routed to another pharmacy.  Your safety is important to [place]. If you have drug allergies check your prescription carefully.  ? ?For the next 24 hours you can use MyChart to ask questions about today's visit, request a non-urgent call back, or ask for a work or school excuse. ?You will get an email in the next two days asking about your experience. I hope that your e-visit has been valuable and will speed your recovery. ? ?

## 2022-01-09 NOTE — Progress Notes (Signed)
I have spent 5 minutes in review of e-visit questionnaire, review and updating patient chart, medical decision making and response to patient.   Mantaj Chamberlin Cody Talitha Dicarlo, PA-C    

## 2022-02-02 ENCOUNTER — Other Ambulatory Visit (HOSPITAL_COMMUNITY): Payer: Self-pay

## 2022-02-02 MED ORDER — MORPHINE SULFATE 15 MG PO TABS
15.0000 mg | ORAL_TABLET | Freq: Four times a day (QID) | ORAL | 0 refills | Status: DC | PRN
  Filled 2022-02-02: qty 120, 30d supply, fill #0

## 2022-02-16 ENCOUNTER — Other Ambulatory Visit: Payer: Self-pay | Admitting: Family Medicine

## 2022-02-16 ENCOUNTER — Other Ambulatory Visit (HOSPITAL_COMMUNITY): Payer: Self-pay

## 2022-02-16 DIAGNOSIS — M542 Cervicalgia: Secondary | ICD-10-CM

## 2022-02-16 MED ORDER — ETODOLAC 300 MG PO CAPS
300.0000 mg | ORAL_CAPSULE | Freq: Two times a day (BID) | ORAL | 3 refills | Status: DC | PRN
  Filled 2022-02-16 – 2022-02-27 (×2): qty 60, 30d supply, fill #0
  Filled 2022-04-01: qty 60, 30d supply, fill #1
  Filled 2022-05-07: qty 60, 30d supply, fill #2
  Filled 2022-07-13: qty 60, 30d supply, fill #3

## 2022-02-16 MED ORDER — LEVOCETIRIZINE DIHYDROCHLORIDE 5 MG PO TABS
5.0000 mg | ORAL_TABLET | Freq: Every evening | ORAL | 0 refills | Status: DC
  Filled 2022-02-16 – 2022-02-27 (×2): qty 90, 90d supply, fill #0

## 2022-02-27 ENCOUNTER — Other Ambulatory Visit (HOSPITAL_COMMUNITY): Payer: Self-pay

## 2022-03-05 ENCOUNTER — Other Ambulatory Visit (HOSPITAL_COMMUNITY): Payer: Self-pay

## 2022-03-05 MED ORDER — MORPHINE SULFATE 15 MG PO TABS
15.0000 mg | ORAL_TABLET | Freq: Four times a day (QID) | ORAL | 0 refills | Status: DC
  Filled 2022-03-05: qty 120, 30d supply, fill #0

## 2022-03-09 ENCOUNTER — Encounter: Payer: Self-pay | Admitting: Family

## 2022-03-09 ENCOUNTER — Ambulatory Visit (INDEPENDENT_AMBULATORY_CARE_PROVIDER_SITE_OTHER)
Admission: RE | Admit: 2022-03-09 | Discharge: 2022-03-09 | Disposition: A | Discharge status: Home / Self Care | Payer: No Typology Code available for payment source | Source: Ambulatory Visit | Attending: Family | Admitting: Family

## 2022-03-09 ENCOUNTER — Ambulatory Visit (INDEPENDENT_AMBULATORY_CARE_PROVIDER_SITE_OTHER): Payer: No Typology Code available for payment source | Admitting: Family

## 2022-03-09 VITALS — BP 112/76 | HR 89 | Temp 97.9°F | Ht 64.0 in | Wt 233.4 lb

## 2022-03-09 DIAGNOSIS — M79641 Pain in right hand: Secondary | ICD-10-CM

## 2022-03-09 NOTE — Patient Instructions (Signed)
It was very nice to see you today!   Go to our [address] office for your xray. We will call you with the results once it has been reviewed.  Continue to apply ice and can take Tylenol up to 1,200mg  3 times per day for pain.      PLEASE NOTE:  If you had any lab tests please let us know if you have not heard back within a few days. You may see your results on MyChart before we have a chance to review them but we will give you a call once they are reviewed by [place]. If we ordered any referrals today, please let us know if you have not heard from their office within the next week.

## 2022-03-09 NOTE — Progress Notes (Signed)
Subjective:     Patient ID: Natasha Welch, female    DOB: 1981/03/02, 41 y.o.   MRN: [id number]  Chief Complaint  Patient presents with   Hand Injury    Pt stated she shut her hand in the door this morning. Has tried ice which does help. Pt can move hand a little. Swollen and red.    HPI: Pain She reports new onset hand pain. There was an injury that may have caused the pain. The pain started today and is staying constant. The pain does not radiate. The pain is described as aching, sharp, stabbing, and throbbing, is severe in intensity, occurring constantly.   Aggravating factors: any movement.  She has tried application of ice with mild relief.   Assessment & Plan:   Problem List Items Addressed This Visit   None Visit Diagnoses     Hand pain, right    -  Primary injury occurred this morning, applied ice, swollen, not able to move fingers, no ecchymosis, sending for xray, advised to go see Ortho after hours clinic tomorrow, pt will need referral with her insurance.    Relevant Orders   DG Hand Complete Right   Ambulatory referral to Orthopedics      Outpatient Medications Prior to Visit  Medication Sig Dispense Refill   albuterol (PROVENTIL HFA;VENTOLIN HFA) 108 (90 Base) MCG/ACT inhaler Inhale 2 puffs into the lungs every 6 (six) hours as needed for wheezing or shortness of breath. 1 Inhaler 1   ALPRAZolam (XANAX) 0.5 MG tablet Take 0.5-1 tablets (0.25-0.5 mg total) by mouth 2 (two) times daily as needed for panic. CAUTION MAY CAUSE SEDATION 30 tablet 1   amphetamine-dextroamphetamine (ADDERALL) 15 MG tablet Take 1 tablet by mouth 2 (two) times daily. 60 tablet 0   amphetamine-dextroamphetamine (ADDERALL) 15 MG tablet Take 1 tablet by mouth 2 (two) times daily. 60 tablet 0   amphetamine-dextroamphetamine (ADDERALL) 15 MG tablet TAKE 1 TABLET BY MOUTH 2 (TWO) TIMES DAILY. 60 tablet 0   etodolac (LODINE) 300 MG capsule Take 1 capsule (300 mg total) by mouth 2 (two) times  daily as needed for pain 60 capsule 3   Insulin Syringe-Needle U-100 (B-D INSULIN SYRINGE 1CC/25GX1") 25G X 1" 1 ML MISC Use with B12 injection. 10 each 3   levocetirizine (XYZAL) 5 MG tablet Take 1 tablet (5 mg total) by mouth every evening. 90 tablet 0   morphine (MSIR) 15 MG tablet Take 1 tablet by mouth 4 times a day as needed. 120 tablet 0   morphine (MSIR) 15 MG tablet Take 15 mg 4 times a day by oral route as needed. 120 tablet 0   morphine (MSIR) 15 MG tablet Take 1 tablet by mouth 4 times a day as needed. 120 tablet 0   morphine (MSIR) 15 MG tablet Take 1 tablet (15 mg total) by mouth 4 (four) times daily as needed. 120 tablet 0   morphine (MSIR) 15 MG tablet Take 15 mg 4 times a day by oral route as needed. 120 tablet 0   morphine (MSIR) 15 MG tablet Take 1 tablet (15 mg total) by mouth 4 (four) times daily as needed. 120 tablet 0   morphine (MSIR) 15 MG tablet Take 1 tablet (15 mg total) by mouth 4 (four) times daily as needed. 120 tablet 0   morphine (MSIR) 15 MG tablet Take 1 tablet (15 mg total) by mouth 4 (four) times daily as needed. 120 tablet 0   morphine (MSIR) 15  MG tablet Take 1 tablet (15 mg total) by mouth 4 (four) times daily as needed. 120 tablet 0   pregabalin (LYRICA) 75 MG capsule TAKE ONE CAPSULE BY MOUTH TWICE DAILY 60 capsule 3   venlafaxine XR (EFFEXOR-XR) 150 MG 24 hr capsule TAKE 1 CAPSULE (150 MG TOTAL) BY MOUTH DAILY WITH BREAKFAST. 90 capsule 3   Vitamin D, Ergocalciferol, (DRISDOL) 1.25 MG (50000 UNIT) CAPS capsule Take 1 capsule (50,000 Units total) by mouth every 7 (seven) days. 12 capsule 0   amoxicillin-clavulanate (AUGMENTIN) 875-125 MG tablet Take 1 tablet by mouth 2 (two) times daily. 20 tablet 0   sulfamethoxazole-trimethoprim (BACTRIM DS) 800-160 MG tablet Take 1 tablet by mouth 2 (two) times daily. 10 tablet 0   pregabalin (LYRICA) 75 MG capsule TAKE ONE CAPSULE BY MOUTH TWICE DAILY 60 capsule 3   No facility-administered medications prior to visit.     Past Medical History:  Diagnosis Date   ADD (attention deficit disorder)    Anemia    Anxiety    adjustment/anxiety related to marriage/divorce 2011   Arthritis    DDD   Asthma    as a child   Complication of anesthesia    high fever during anesthesia with tonsillectomy, states she was brought out and put under another way. States that Malignant Hyperthermia was ruled out   Depression    Endometriosis    Family history of adverse reaction to anesthesia    mom had trouble being put under with anesthesia   GERD (gastroesophageal reflux disease)    Migraine    Nerve damage    Pneumonia    Scoliosis    facet injection 2014    Past Surgical History:  Procedure Laterality Date   ABDOMINAL HYSTERECTOMY     BREAST SURGERY  2012   B implants   CESAREAN SECTION     1999 and 2002   LYSIS OF ADHESION Left 12/13/2017   Procedure: LYSIS OF ADHESION;  Surgeon: Beverely LowNorris, Steve, MD;  Location: [facility] OR;  Service: Orthopedics;  Laterality: Left;   SHOULDER ARTHROSCOPY Left 12/13/2017   Procedure: LEFT SHOULDER DIAGNOSTIC ARTHROSCOPY, BURSECTOMY PARTIAL SUBSCAPULAR DEBRIDEMENT.;  Surgeon: Beverely LowNorris, Steve, MD;  [facility];  Service: Orthopedics;  Laterality: Left;   TONSILLECTOMY  1990    Allergies  Allergen Reactions   Tetanus Toxoid Other (See Comments)    Local reaction   Ciprofloxacin Hcl Nausea And Vomiting   Corticosteroids Other (See Comments)    Intolerant of injection, with inc pain afterward   Doxycycline Nausea And Vomiting   Latex Dermatitis and Other (See Comments)    Red skin, peeling skin       Objective:    Physical Exam Vitals and nursing note reviewed.  Constitutional:      Appearance: Normal appearance.  Cardiovascular:     Rate and Rhythm: Normal rate and regular rhythm.  Pulmonary:     Effort: Pulmonary effort is normal.     Breath sounds: Normal breath sounds.  Musculoskeletal:     Right hand: Swelling and tenderness present. Decreased range of  motion.  Skin:    General: Skin is warm and dry.  Neurological:     Mental Status: She is alert.  Psychiatric:        Mood and Affect: Mood normal.        Behavior: Behavior normal.     BP 112/76 (BP Location: Left Arm, Patient Position: Sitting, Cuff Size: Large)   Pulse 89   Temp 97.9  F (36.6 C) (Temporal)   Ht 5\' 4"  (1.626 m)   Wt 233 lb 6 oz (105.9 kg)   SpO2 97%   BMI 40.06 kg/m  Wt Readings from Last 3 Encounters:  03/09/22 233 lb 6 oz (105.9 kg)  03/30/21 227 lb (103 kg)  03/29/20 218 lb 1 oz (98.9 kg)        No orders of the defined types were placed in this encounter.   03/31/20, NP

## 2022-03-11 NOTE — Progress Notes (Signed)
Hi Natasha Welch,  Your xray is negative for any fracture. Referral was sent to [facility], continue to follow up with them if needed.

## 2022-04-02 ENCOUNTER — Other Ambulatory Visit (HOSPITAL_COMMUNITY): Payer: Self-pay

## 2022-04-04 ENCOUNTER — Other Ambulatory Visit (HOSPITAL_COMMUNITY): Payer: Self-pay

## 2022-04-04 MED ORDER — MORPHINE SULFATE 15 MG PO TABS
15.0000 mg | ORAL_TABLET | Freq: Four times a day (QID) | ORAL | 0 refills | Status: DC | PRN
  Filled 2022-04-04: qty 120, 30d supply, fill #0

## 2022-04-06 ENCOUNTER — Other Ambulatory Visit (HOSPITAL_COMMUNITY): Payer: Self-pay

## 2022-04-20 ENCOUNTER — Other Ambulatory Visit (HOSPITAL_COMMUNITY): Payer: Self-pay

## 2022-04-25 ENCOUNTER — Other Ambulatory Visit (HOSPITAL_COMMUNITY): Payer: Self-pay

## 2022-05-01 ENCOUNTER — Other Ambulatory Visit (HOSPITAL_COMMUNITY): Payer: Self-pay

## 2022-05-02 ENCOUNTER — Other Ambulatory Visit (HOSPITAL_COMMUNITY): Payer: Self-pay

## 2022-05-03 ENCOUNTER — Other Ambulatory Visit (HOSPITAL_COMMUNITY): Payer: Self-pay

## 2022-05-04 ENCOUNTER — Other Ambulatory Visit (HOSPITAL_COMMUNITY): Payer: Self-pay

## 2022-05-04 ENCOUNTER — Other Ambulatory Visit: Payer: Self-pay | Admitting: Family Medicine

## 2022-05-04 MED ORDER — AMPHETAMINE-DEXTROAMPHETAMINE 15 MG PO TABS
15.0000 mg | ORAL_TABLET | Freq: Two times a day (BID) | ORAL | 0 refills | Status: DC
  Filled 2022-05-04 – 2022-07-13 (×3): qty 60, 30d supply, fill #0

## 2022-05-04 MED ORDER — AMPHETAMINE-DEXTROAMPHETAMINE 15 MG PO TABS
1.0000 | ORAL_TABLET | Freq: Two times a day (BID) | ORAL | 0 refills | Status: DC
  Filled 2022-05-04: qty 60, 30d supply, fill #0

## 2022-05-04 MED ORDER — MORPHINE SULFATE 15 MG PO TABS
15.0000 mg | ORAL_TABLET | Freq: Four times a day (QID) | ORAL | 0 refills | Status: DC | PRN
  Filled 2022-05-07: qty 120, 30d supply, fill #0

## 2022-05-04 MED ORDER — AMPHETAMINE-DEXTROAMPHETAMINE 15 MG PO TABS
15.0000 mg | ORAL_TABLET | Freq: Two times a day (BID) | ORAL | 0 refills | Status: DC
  Filled 2022-05-04 – 2022-08-29 (×2): qty 60, 30d supply, fill #0

## 2022-05-04 NOTE — Telephone Encounter (Signed)
Sent. Thanks.   

## 2022-05-04 NOTE — Telephone Encounter (Signed)
Refill request for   amphetamine-dextroamphetamine (ADDERALL) 15 MG tablet      LOV - 03/30/21 Next OV - not scheduled Last refill - 01/09/22 #60/0 x 3

## 2022-05-07 ENCOUNTER — Telehealth: Payer: No Typology Code available for payment source | Admitting: Physician Assistant

## 2022-05-07 ENCOUNTER — Other Ambulatory Visit (HOSPITAL_COMMUNITY): Payer: Self-pay

## 2022-05-07 DIAGNOSIS — J019 Acute sinusitis, unspecified: Secondary | ICD-10-CM | POA: Diagnosis not present

## 2022-05-07 DIAGNOSIS — B9689 Other specified bacterial agents as the cause of diseases classified elsewhere: Secondary | ICD-10-CM

## 2022-05-07 MED ORDER — AMOXICILLIN-POT CLAVULANATE 875-125 MG PO TABS
1.0000 | ORAL_TABLET | Freq: Two times a day (BID) | ORAL | 0 refills | Status: DC
  Filled 2022-05-07: qty 14, 7d supply, fill #0

## 2022-05-07 NOTE — Progress Notes (Signed)

## 2022-05-08 ENCOUNTER — Other Ambulatory Visit (HOSPITAL_COMMUNITY): Payer: Self-pay

## 2022-05-25 ENCOUNTER — Other Ambulatory Visit (HOSPITAL_COMMUNITY): Payer: Self-pay

## 2022-06-12 ENCOUNTER — Other Ambulatory Visit (HOSPITAL_COMMUNITY): Payer: Self-pay

## 2022-06-13 ENCOUNTER — Other Ambulatory Visit (HOSPITAL_COMMUNITY): Payer: Self-pay

## 2022-06-13 MED ORDER — MORPHINE SULFATE 15 MG PO TABS
15.0000 mg | ORAL_TABLET | Freq: Four times a day (QID) | ORAL | 0 refills | Status: DC | PRN
  Filled 2022-06-13 (×2): qty 120, 30d supply, fill #0

## 2022-06-26 ENCOUNTER — Other Ambulatory Visit (HOSPITAL_COMMUNITY): Payer: Self-pay

## 2022-07-06 ENCOUNTER — Other Ambulatory Visit (HOSPITAL_COMMUNITY): Payer: Self-pay

## 2022-07-10 ENCOUNTER — Telehealth: Payer: No Typology Code available for payment source | Admitting: Family Medicine

## 2022-07-10 ENCOUNTER — Other Ambulatory Visit (HOSPITAL_COMMUNITY): Payer: Self-pay

## 2022-07-10 DIAGNOSIS — B9689 Other specified bacterial agents as the cause of diseases classified elsewhere: Secondary | ICD-10-CM

## 2022-07-10 DIAGNOSIS — J019 Acute sinusitis, unspecified: Secondary | ICD-10-CM | POA: Diagnosis not present

## 2022-07-10 MED ORDER — CEFDINIR 300 MG PO CAPS
300.0000 mg | ORAL_CAPSULE | Freq: Two times a day (BID) | ORAL | 0 refills | Status: AC
  Filled 2022-07-10: qty 14, 7d supply, fill #0

## 2022-07-10 NOTE — Progress Notes (Signed)
E-Visit for Sinus Problems  We are sorry that you are not feeling well.  Here is how we plan to help!  Based on what you have shared with me it looks like you have sinusitis.  Sinusitis is inflammation and infection in the sinus cavities of the head.  Based on your presentation I believe you most likely have Acute Bacterial Sinusitis.  This is an infection caused by bacteria and is treated with antibiotics. I have prescribed Cefdinir 300mg  twice daily for 7 days. You have been on Augmentin within the last 3 months, so we need to change the type of Antibiotic to help prevent resistance. You may use an oral decongestant such as Mucinex D or if you have glaucoma or high blood pressure use plain Mucinex. Saline nasal spray help and can safely be used as often as needed for congestion.  If you develop worsening sinus pain, fever or notice severe headache and vision changes, or if symptoms are not better after completion of antibiotic, please schedule an appointment with a health care provider.    Sinus infections are not as easily transmitted as other respiratory infection, however we still recommend that you avoid close contact with loved ones, especially the very young and elderly.  Remember to wash your hands thoroughly throughout the day as this is the number one way to prevent the spread of infection!  Home Care: Only take medications as instructed by your medical team. Complete the entire course of an antibiotic. Do not take these medications with alcohol. A steam or ultrasonic humidifier can help congestion.  You can place a towel over your head and breathe in the steam from hot water coming from a faucet. Avoid close contacts especially the very young and the elderly. Cover your mouth when you cough or sneeze. Always remember to wash your hands.  Get Help Right Away If: You develop worsening fever or sinus pain. You develop a severe head ache or visual changes. Your symptoms persist after you  have completed your treatment plan.  Make sure you Understand these instructions. Will watch your condition. Will get help right away if you are not doing well or get worse.  Thank you for choosing an e-visit.  Your e-visit answers were reviewed by a board certified advanced clinical practitioner to complete your personal care plan. Depending upon the condition, your plan could have included both over the counter or prescription medications.  Please review your pharmacy choice. Make sure the pharmacy is open so you can pick up prescription now. If there is a problem, you may contact your provider through [employer] and have the prescription routed to another pharmacy.  Your safety is important to [place]. If you have drug allergies check your prescription carefully.   For the next 24 hours you can use MyChart to ask questions about today's visit, request a non-urgent call back, or ask for a work or school excuse. You will get an email in the next two days asking about your experience. I hope that your e-visit has been valuable and will speed your recovery.  I provided 5 minutes of non face-to-face time during this encounter for chart review, medication and order placement, as well as and documentation.

## 2022-07-13 ENCOUNTER — Other Ambulatory Visit: Payer: Self-pay | Admitting: Family Medicine

## 2022-07-13 ENCOUNTER — Other Ambulatory Visit (HOSPITAL_COMMUNITY): Payer: Self-pay

## 2022-07-13 MED ORDER — MORPHINE SULFATE 15 MG PO TABS
15.0000 mg | ORAL_TABLET | Freq: Four times a day (QID) | ORAL | 0 refills | Status: DC | PRN
  Filled 2022-07-13: qty 120, 30d supply, fill #0

## 2022-07-13 NOTE — Telephone Encounter (Signed)
Patient should have refill on file at pharmacy. EMR wont let me deny rx.

## 2022-07-13 NOTE — Telephone Encounter (Signed)
Patient is overdue for an appt. Please call to schedule.

## 2022-07-15 MED ORDER — VENLAFAXINE HCL ER 150 MG PO CP24
150.0000 mg | ORAL_CAPSULE | Freq: Every day | ORAL | 1 refills | Status: DC
  Filled 2022-07-15 – 2022-08-29 (×2): qty 90, 90d supply, fill #0
  Filled 2023-01-27: qty 90, 90d supply, fill #1

## 2022-07-15 NOTE — Telephone Encounter (Signed)
Now scheduled in EMR.  Rx sent.  Thanks.

## 2022-07-16 ENCOUNTER — Other Ambulatory Visit (HOSPITAL_COMMUNITY): Payer: Self-pay

## 2022-07-17 ENCOUNTER — Telehealth: Payer: No Typology Code available for payment source | Admitting: Emergency Medicine

## 2022-07-17 DIAGNOSIS — L03213 Periorbital cellulitis: Secondary | ICD-10-CM

## 2022-07-17 NOTE — Progress Notes (Signed)
Because you are already taking antibiotics and because your infection is around your eye, I feel your condition warrants further evaluation and I recommend that you be seen in a face to face visit.   NOTE: There will be NO CHARGE for this eVisit   If you are having a true medical emergency please call 911.      For an urgent face to face visit, [facility] has seven urgent care centers for your convenience:     [facility] [place] Get Driving Directions [phone] [address], [place] [address]    [facility]) Get Driving Directions [phone] [place], [place] [address]  [facility] [place] ([place]) Get Driving Directions [phone] [address] [place],  [place]  [address]  [facility] - at [address] Get Driving Directions  [phone] [address] [place],  [place] [address]   [facility] Get Driving Directions [phone] [place], [place], [place] [address]   [facility] Get Driving Directions  [phone] [address] [place], [place] [address]   [facility] [place] Get Driving Directions [phone] [address], [place],  [address]  Your MyChart E-visit questionnaire answers were reviewed by a board certified advanced clinical practitioner to complete your personal care plan based on your specific symptoms.  Thank you for using e-Visits.

## 2022-07-19 ENCOUNTER — Encounter: Payer: Self-pay | Admitting: Family Medicine

## 2022-07-19 ENCOUNTER — Ambulatory Visit (INDEPENDENT_AMBULATORY_CARE_PROVIDER_SITE_OTHER): Payer: No Typology Code available for payment source | Admitting: Family Medicine

## 2022-07-19 ENCOUNTER — Other Ambulatory Visit: Payer: Self-pay

## 2022-07-19 VITALS — BP 112/74 | HR 86 | Temp 98.3°F | Resp 16 | Ht 64.0 in | Wt 234.0 lb

## 2022-07-19 DIAGNOSIS — B029 Zoster without complications: Secondary | ICD-10-CM | POA: Diagnosis not present

## 2022-07-19 MED ORDER — VALACYCLOVIR HCL 1 G PO TABS
1000.0000 mg | ORAL_TABLET | Freq: Three times a day (TID) | ORAL | 0 refills | Status: DC
  Filled 2022-07-19: qty 21, 7d supply, fill #0

## 2022-07-19 NOTE — Progress Notes (Signed)
Patient ID: Natasha Welch, female    DOB: June 12, 1981, 41 y.o.   MRN: [id number]  This visit was conducted in person.  BP 112/74   Pulse 86   Temp 98.3 F (36.8 C)   Resp 16   Ht 5\' 4"  (1.626 m)   Wt 234 lb (106.1 kg)   SpO2 99%   BMI 40.17 kg/m    CC:  Chief Complaint  Patient presents with   Facial Swelling    X 4 days     Subjective:   HPI: Natasha Welch is a 41 y.o. female presenting on 07/19/2022 for Facial Swelling (X 4 days )   4 days ago she noted sudden onset of sensitive skin on right upper face. Noted headache.  Noted blister  and bumps on eye brow.  She has not taken any med for pain.  No change in vision.  Swelling in eye lid and cheek.  Right cervical nodes are swollen.     Recent sinus infection. 101/10.Marland Kitchen treated with  cefdinir.  Those symptoms have been resolved.    On etodolac.  On MSIR for chronic back pain.. managed by Dr. Nelva Bush.  Relevant past medical, surgical, family and social history reviewed and updated as indicated. Interim medical history since our last visit reviewed. Allergies and medications reviewed and updated. Outpatient Medications Prior to Visit  Medication Sig Dispense Refill   albuterol (PROVENTIL HFA;VENTOLIN HFA) 108 (90 Base) MCG/ACT inhaler Inhale 2 puffs into the lungs every 6 (six) hours as needed for wheezing or shortness of breath. 1 Inhaler 1   amphetamine-dextroamphetamine (ADDERALL) 15 MG tablet Take 1 tablet by mouth 2 (two) times daily. 60 tablet 0   amphetamine-dextroamphetamine (ADDERALL) 15 MG tablet Take 1 tablet by mouth 2 (two) times daily. 60 tablet 0   amphetamine-dextroamphetamine (ADDERALL) 15 MG tablet Take 1 tablet by mouth 2 (two) times daily. 60 tablet 0   etodolac (LODINE) 300 MG capsule Take 1 capsule (300 mg total) by mouth 2 (two) times daily as needed for pain 60 capsule 3   Insulin Syringe-Needle U-100 (B-D INSULIN SYRINGE 1CC/25GX1") 25G X 1" 1 ML MISC Use with B12 injection. 10 each 3    levocetirizine (XYZAL) 5 MG tablet Take 1 tablet (5 mg total) by mouth every evening. 90 tablet 0   morphine (MSIR) 15 MG tablet Take 1 tablet (15 mg total) by mouth 4 (four) times daily as needed. 120 tablet 0   pregabalin (LYRICA) 75 MG capsule TAKE ONE CAPSULE BY MOUTH TWICE DAILY 60 capsule 3   venlafaxine XR (EFFEXOR-XR) 150 MG 24 hr capsule Take 1 capsule (150 mg total) by mouth daily with breakfast. 90 capsule 1   Vitamin D, Ergocalciferol, (DRISDOL) 1.25 MG (50000 UNIT) CAPS capsule Take 1 capsule (50,000 Units total) by mouth every 7 (seven) days. 12 capsule 0   morphine (MSIR) 15 MG tablet Take 1 tablet by mouth 4 times a day as needed. 120 tablet 0   morphine (MSIR) 15 MG tablet Take 15 mg 4 times a day by oral route as needed. 120 tablet 0   morphine (MSIR) 15 MG tablet Take 1 tablet by mouth 4 times a day as needed. 120 tablet 0   morphine (MSIR) 15 MG tablet Take 1 tablet (15 mg total) by mouth 4 (four) times daily as needed. 120 tablet 0   morphine (MSIR) 15 MG tablet Take 15 mg 4 times a day by oral route as needed. 120 tablet 0  morphine (MSIR) 15 MG tablet Take 1 tablet (15 mg total) by mouth 4 (four) times daily as needed. 120 tablet 0   morphine (MSIR) 15 MG tablet Take 1 tablet (15 mg total) by mouth 4 (four) times daily as needed. 120 tablet 0   morphine (MSIR) 15 MG tablet Take 1 tablet (15 mg total) by mouth 4 (four) times daily as needed. 120 tablet 0   pregabalin (LYRICA) 75 MG capsule TAKE ONE CAPSULE BY MOUTH TWICE DAILY 60 capsule 3   morphine (MSIR) 15 MG tablet Take 1 tablet (15 mg total) by mouth 4 (four) times daily as needed. 120 tablet 0   No facility-administered medications prior to visit.     Per HPI unless specifically indicated in ROS section below Review of Systems  Constitutional:  Negative for fatigue and fever.  HENT:  Negative for congestion.   Eyes:  Negative for pain.  Respiratory:  Negative for cough and shortness of breath.    Cardiovascular:  Negative for chest pain, palpitations and leg swelling.  Gastrointestinal:  Negative for abdominal pain.  Genitourinary:  Negative for dysuria and vaginal bleeding.  Musculoskeletal:  Negative for back pain.  Neurological:  Negative for syncope, light-headedness and headaches.  Psychiatric/Behavioral:  Negative for dysphoric mood.    Objective:  BP 112/74   Pulse 86   Temp 98.3 F (36.8 C)   Resp 16   Ht 5\' 4"  (1.626 m)   Wt 234 lb (106.1 kg)   SpO2 99%   BMI 40.17 kg/m   Wt Readings from Last 3 Encounters:  07/19/22 234 lb (106.1 kg)  03/09/22 233 lb 6 oz (105.9 kg)  03/30/21 227 lb (103 kg)      Physical Exam Constitutional:      General: She is not in acute distress.    Appearance: Normal appearance. She is well-developed. She is not ill-appearing or toxic-appearing.  HENT:     Head: Normocephalic.     Right Ear: Hearing, tympanic membrane, ear canal and external ear normal. Tympanic membrane is not erythematous, retracted or bulging.     Left Ear: Hearing, tympanic membrane, ear canal and external ear normal. Tympanic membrane is not erythematous, retracted or bulging.     Nose: No mucosal edema or rhinorrhea.     Right Sinus: No maxillary sinus tenderness or frontal sinus tenderness.     Left Sinus: No maxillary sinus tenderness or frontal sinus tenderness.     Mouth/Throat:     Pharynx: Uvula midline.  Eyes:     General: Lids are normal. Lids are everted, no foreign bodies appreciated.     Conjunctiva/sclera: Conjunctivae normal.     Pupils: Pupils are equal, round, and reactive to light.  Neck:     Thyroid: No thyroid mass or thyromegaly.     Vascular: No carotid bruit.     Trachea: Trachea normal.  Cardiovascular:     Rate and Rhythm: Normal rate and regular rhythm.     Pulses: Normal pulses.     Heart sounds: Normal heart sounds, S1 normal and S2 normal. No murmur heard.    No friction rub. No gallop.  Pulmonary:     Effort: Pulmonary  effort is normal. No tachypnea or respiratory distress.     Breath sounds: Normal breath sounds. No decreased breath sounds, wheezing, rhonchi or rales.  Abdominal:     General: Bowel sounds are normal.     Palpations: Abdomen is soft.     Tenderness: There is  no abdominal tenderness.  Musculoskeletal:     Cervical back: Normal range of motion and neck supple.  Skin:    General: Skin is warm and dry.     Findings: Rash present.  Neurological:     Mental Status: She is alert.  Psychiatric:        Mood and Affect: Mood is not anxious or depressed.        Speech: Speech normal.        Behavior: Behavior normal. Behavior is cooperative.        Thought Content: Thought content normal.        Judgment: Judgment normal.        Results for orders placed or performed in visit on 03/30/21  DRUG MONITORING, PANEL 8 WITH CONFIRMATION, URINE  Result Value Ref Range   Alcohol Metabolites NEGATIVE <500 ng/mL   Amphetamines POSITIVE (A) <500 ng/mL   Amphetamine 8,183 (H) <250 ng/mL   Methamphetamine NEGATIVE <250 ng/mL   Amphetamines Comments     Benzodiazepines POSITIVE (A) <100 ng/mL   Alphahydroxyalprazolam 54 (H) <25 ng/mL   Alphahydroxymidazolam NEGATIVE <50 ng/mL   Alphahydroxytriazolam NEGATIVE <50 ng/mL   Aminoclonazepam NEGATIVE <25 ng/mL   Hydroxyethylflurazepam NEGATIVE <50 ng/mL   Lorazepam NEGATIVE <50 ng/mL   Nordiazepam NEGATIVE <50 ng/mL   Oxazepam NEGATIVE <50 ng/mL   Temazepam NEGATIVE <50 ng/mL   Benzodiazepines Comments     Buprenorphine, Urine NEGATIVE <5 ng/mL   Cocaine Metabolite NEGATIVE <150 ng/mL   6 Acetylmorphine NEGATIVE <10 ng/mL   Marijuana Metabolite NEGATIVE <20 ng/mL   MDMA NEGATIVE <500 ng/mL   Opiates POSITIVE (A) <100 ng/mL   Codeine NEGATIVE <50 ng/mL   Hydrocodone NEGATIVE <50 ng/mL   Hydromorphone 68 (H) <50 ng/mL   Morphine >10,000 (H) <50 ng/mL   Norhydrocodone NEGATIVE <50 ng/mL   Opiates Comments     Oxycodone NEGATIVE <100 ng/mL    Creatinine 111.7 > or = 20.0 mg/dL   pH 5.8 4.5 - 9.0   Oxidant NEGATIVE <200 mcg/mL  VITAMIN D 25 Hydroxy (Vit-D Deficiency, Fractures)  Result Value Ref Range   VITD 13.89 (L) 30.00 - 100.00 ng/mL  Vitamin B12  Result Value Ref Range   Vitamin B-12 512 211 - 911 pg/mL  DM TEMPLATE  Result Value Ref Range   Notes and Comments       COVID 19 screen:  No recent travel or known exposure to COVID19 The patient denies respiratory symptoms of COVID 19 at this time. The importance of social distancing was discussed today.   Assessment and Plan    Problem List Items Addressed This Visit     Herpes zoster without complication - Primary     Acute, Will start treatment with 7 days of valacyclovir 3 times daily. Stressed importance of pain control.  She is currently on room 1 times daily improvement in pain.  She is not currently taking etodolac so I have told her to start taking etodolac 300 mg twice daily as needed for pain.  She is on Lyrica and I suggested she try to increase the frequency of this medication but she states she has significant swelling and higher dosing could be malignancy.  Encouraged her to make an appointment with her ophthalmologist ASAP to evaluate for zoster involvement.  She will let me know if she has trouble getting an appointment to be seen.      Relevant Medications   valACYclovir (VALTREX) 1000 MG tablet   Meds ordered this  encounter  Medications   valACYclovir (VALTREX) 1000 MG tablet    Sig: Take 1 tablet (1,000 mg total) by mouth 3 (three) times daily.    Dispense:  21 tablet    Refill:  0    Kerby Nora, MD

## 2022-07-19 NOTE — Assessment & Plan Note (Signed)
Acute, Will start treatment with 7 days of valacyclovir 3 times daily. Stressed importance of pain control.  She is currently on room 1 times daily improvement in pain.  She is not currently taking etodolac so I have told her to start taking etodolac 300 mg twice daily as needed for pain.  She is on Lyrica and I suggested she try to increase the frequency of this medication but she states she has significant swelling and higher dosing could be malignancy.  Encouraged her to make an appointment with her ophthalmologist ASAP to evaluate for zoster involvement.  She will let me know if she has trouble getting an appointment to be seen.

## 2022-07-26 ENCOUNTER — Other Ambulatory Visit (HOSPITAL_COMMUNITY): Payer: Self-pay

## 2022-07-26 ENCOUNTER — Ambulatory Visit (INDEPENDENT_AMBULATORY_CARE_PROVIDER_SITE_OTHER): Payer: No Typology Code available for payment source | Admitting: Family Medicine

## 2022-07-26 ENCOUNTER — Encounter: Payer: Self-pay | Admitting: Family Medicine

## 2022-07-26 VITALS — BP 116/80 | HR 94 | Temp 97.2°F | Ht 64.0 in | Wt 235.0 lb

## 2022-07-26 DIAGNOSIS — E559 Vitamin D deficiency, unspecified: Secondary | ICD-10-CM

## 2022-07-26 DIAGNOSIS — E538 Deficiency of other specified B group vitamins: Secondary | ICD-10-CM

## 2022-07-26 DIAGNOSIS — G8929 Other chronic pain: Secondary | ICD-10-CM

## 2022-07-26 DIAGNOSIS — Z7189 Other specified counseling: Secondary | ICD-10-CM

## 2022-07-26 DIAGNOSIS — F4320 Adjustment disorder, unspecified: Secondary | ICD-10-CM

## 2022-07-26 DIAGNOSIS — F988 Other specified behavioral and emotional disorders with onset usually occurring in childhood and adolescence: Secondary | ICD-10-CM

## 2022-07-26 DIAGNOSIS — Z0000 Encounter for general adult medical examination without abnormal findings: Secondary | ICD-10-CM | POA: Diagnosis not present

## 2022-07-26 DIAGNOSIS — E785 Hyperlipidemia, unspecified: Secondary | ICD-10-CM

## 2022-07-26 DIAGNOSIS — L719 Rosacea, unspecified: Secondary | ICD-10-CM

## 2022-07-26 DIAGNOSIS — B029 Zoster without complications: Secondary | ICD-10-CM

## 2022-07-26 MED ORDER — PREGABALIN 75 MG PO CAPS: 75.0000 mg | ORAL_CAPSULE | ORAL | Status: DC

## 2022-07-26 MED ORDER — "BD INSULIN SYRINGE 25G X 1"" 1 ML MISC"
3 refills | Status: DC
  Filled 2022-07-26: qty 10, 10d supply, fill #0

## 2022-07-26 MED ORDER — METRONIDAZOLE 1 % EX GEL
1.0000 | Freq: Every day | CUTANEOUS | 3 refills | Status: DC
  Filled 2022-07-26: qty 60, 30d supply, fill #0
  Filled 2022-09-11: qty 60, 30d supply, fill #1

## 2022-07-26 MED ORDER — ALPRAZOLAM 0.5 MG PO TABS
0.2500 mg | ORAL_TABLET | Freq: Two times a day (BID) | ORAL | 1 refills | Status: DC | PRN
  Filled 2022-07-26: qty 30, 15d supply, fill #0
  Filled 2022-11-09: qty 30, 15d supply, fill #1

## 2022-07-26 MED ORDER — VITAMIN D3 50 MCG (2000 UT) PO CAPS: 2000.0000 [IU] | ORAL_CAPSULE | Freq: Every day | ORAL | Status: DC

## 2022-07-26 MED ORDER — CYANOCOBALAMIN 1000 MCG/ML IJ SOLN
1000.0000 ug | INTRAMUSCULAR | 3 refills | Status: DC
  Filled 2022-07-26: qty 3, 90d supply, fill #0
  Filled 2023-03-15: qty 3, 90d supply, fill #1

## 2022-07-26 NOTE — Progress Notes (Signed)
CPE- See plan.  Routine anticipatory guidance given to patient.  See health maintenance.  The possibility exists that previously documented standard health maintenance information may have been brought forward from a previous encounter into this note.  If needed, that same information has been updated to reflect the current situation based on today's encounter.    Pap and DXA not due. Mammogram d/w pt.  Encouraged.   Colon cancer screening not due tdap 2015 covid vaccine 2021 Flu shot done at work yearly PNA and shingles not due Living will d/w pt- she would have her mother designated if patient were incapacitated. Diet and exercise d/w pt. HIV screen neg ~2002 per patient.   HCV prev done with prenatal labs.    R facial shingles.  HA ongoing but some better.  Unilateral.  Taking 75mg  lyrica QOD to limit swelling in the ankles.   Finishing valtrex today.    H/o B12 def. on replacement.  Compliant.  Follow-up labs pending.  ADD d/w pt.  No tremor, no insomnia.  Relief with med.  She can tell on/off with med.    Metrogel used prn with relief of rosacea.    Mood d/w pt.  Safe at home.  Still taking venlafaxine.  She sets an alarm to take her med.  I thanked her for her effort.  Rare use of xanax.  Not sedated.    Still on chronic pain meds at baseline.  Per outside clinic.  Not sedated.  PMH and SH reviewed  Meds, vitals, and allergies reviewed.   ROS: Per HPI.  Unless specifically indicated otherwise in HPI, the patient denies:  General: fever. Eyes: acute vision changes ENT: sore throat Cardiovascular: chest pain Respiratory: SOB GI: vomiting GU: dysuria Musculoskeletal: acute back pain Derm: acute rash Neuro: acute motor dysfunction Psych: worsening mood Endocrine: polydipsia Heme: bleeding Allergy: hayfever  GEN: nad, alert and oriented HEENT: ncat, scabbed lesion on R eyebrow.   NECK: supple w/o LA CV: rrr. PULM: ctab, no inc wob ABD: soft, +bs EXT: no  edema SKIN: no acute rash

## 2022-07-26 NOTE — Patient Instructions (Signed)
Don't change your meds for now. Go to the lab on the way out.   If you have mychart we'll likely use that to update you.    Take care.  Glad to see you. 

## 2022-07-27 ENCOUNTER — Other Ambulatory Visit (HOSPITAL_COMMUNITY): Payer: Self-pay

## 2022-07-29 DIAGNOSIS — L719 Rosacea, unspecified: Secondary | ICD-10-CM | POA: Insufficient documentation

## 2022-07-29 NOTE — Assessment & Plan Note (Signed)
Improving some in the meantime.  Would continue taking 75mg  lyrica QOD to limit swelling in the ankles.   Finishing valtrex today.  Update me as needed.

## 2022-07-29 NOTE — Assessment & Plan Note (Signed)
Mood d/w pt.  Safe at home.  Still taking venlafaxine.  She sets an alarm to take her med.  I thanked her for her effort.  Rare use of xanax.  Not sedated.  Continue as is.

## 2022-07-29 NOTE — Assessment & Plan Note (Signed)
Pap and DXA not due. Mammogram d/w pt.  Encouraged.   Colon cancer screening not due tdap 2015 covid vaccine 2021 Flu shot done at work yearly PNA and shingles not due Living will d/w pt- she would have her mother designated if patient were incapacitated. Diet and exercise d/w pt. HIV screen neg ~2002 per patient.   HCV prev done with prenatal labs.

## 2022-07-29 NOTE — Assessment & Plan Note (Signed)
Continue replacement.  See notes on labs. ?

## 2022-07-29 NOTE — Assessment & Plan Note (Signed)
Per outside clinic.  She will return for UDS.  We can forward her results.

## 2022-07-29 NOTE — Assessment & Plan Note (Signed)
Living will d/w pt- she would have her mother designated if patient were incapacitated.

## 2022-07-29 NOTE — Assessment & Plan Note (Signed)
Continue Adderall.  She gets relief from medication.

## 2022-07-29 NOTE — Assessment & Plan Note (Signed)
Recent MetroGel 1 done with shingles.

## 2022-07-30 ENCOUNTER — Other Ambulatory Visit (HOSPITAL_COMMUNITY): Payer: Self-pay

## 2022-07-31 ENCOUNTER — Other Ambulatory Visit (INDEPENDENT_AMBULATORY_CARE_PROVIDER_SITE_OTHER): Payer: No Typology Code available for payment source

## 2022-07-31 ENCOUNTER — Other Ambulatory Visit: Payer: Self-pay | Admitting: Family Medicine

## 2022-07-31 ENCOUNTER — Other Ambulatory Visit (HOSPITAL_COMMUNITY): Payer: Self-pay

## 2022-07-31 DIAGNOSIS — E559 Vitamin D deficiency, unspecified: Secondary | ICD-10-CM | POA: Diagnosis not present

## 2022-07-31 DIAGNOSIS — F988 Other specified behavioral and emotional disorders with onset usually occurring in childhood and adolescence: Secondary | ICD-10-CM

## 2022-07-31 DIAGNOSIS — M549 Dorsalgia, unspecified: Secondary | ICD-10-CM | POA: Diagnosis not present

## 2022-07-31 DIAGNOSIS — E538 Deficiency of other specified B group vitamins: Secondary | ICD-10-CM

## 2022-07-31 DIAGNOSIS — G8929 Other chronic pain: Secondary | ICD-10-CM

## 2022-07-31 DIAGNOSIS — E785 Hyperlipidemia, unspecified: Secondary | ICD-10-CM

## 2022-07-31 LAB — CBC WITH DIFFERENTIAL/PLATELET
Basophils Absolute: 0.1 10*3/uL (ref 0.0–0.1)
Basophils Relative: 0.7 % (ref 0.0–3.0)
Eosinophils Absolute: 0.5 10*3/uL (ref 0.0–0.7)
Eosinophils Relative: 6.5 % — ABNORMAL HIGH (ref 0.0–5.0)
HCT: 41.9 % (ref 36.0–46.0)
Hemoglobin: 14.1 g/dL (ref 12.0–15.0)
Lymphocytes Relative: 33.3 % (ref 12.0–46.0)
Lymphs Abs: 2.6 10*3/uL (ref 0.7–4.0)
MCHC: 33.6 g/dL (ref 30.0–36.0)
MCV: 85.8 fl (ref 78.0–100.0)
Monocytes Absolute: 0.4 10*3/uL (ref 0.1–1.0)
Monocytes Relative: 5.6 % (ref 3.0–12.0)
Neutro Abs: 4.3 10*3/uL (ref 1.4–7.7)
Neutrophils Relative %: 53.9 % (ref 43.0–77.0)
Platelets: 291 10*3/uL (ref 150.0–400.0)
RBC: 4.88 Mil/uL (ref 3.87–5.11)
RDW: 13.6 % (ref 11.5–15.5)
WBC: 7.9 10*3/uL (ref 4.0–10.5)

## 2022-07-31 LAB — COMPREHENSIVE METABOLIC PANEL
ALT: 15 U/L (ref 0–35)
AST: 15 U/L (ref 0–37)
Albumin: 4.3 g/dL (ref 3.5–5.2)
Alkaline Phosphatase: 46 U/L (ref 39–117)
BUN: 10 mg/dL (ref 6–23)
CO2: 27 mEq/L (ref 19–32)
Calcium: 8.9 mg/dL (ref 8.4–10.5)
Chloride: 101 mEq/L (ref 96–112)
Creatinine, Ser: 0.94 mg/dL (ref 0.40–1.20)
GFR: 74.59 mL/min (ref >60.00)
Glucose, Bld: 98 mg/dL (ref 70–99)
Potassium: 4.1 mEq/L (ref 3.5–5.1)
Sodium: 137 mEq/L (ref 135–145)
Total Bilirubin: 0.6 mg/dL (ref 0.2–1.2)
Total Protein: 7 g/dL (ref 6.0–8.3)

## 2022-07-31 LAB — LIPID PANEL
Cholesterol: 177 mg/dL (ref 0–200)
HDL: 36.9 mg/dL — ABNORMAL LOW (ref >39.00)
LDL Cholesterol: 123 mg/dL — ABNORMAL HIGH (ref 0–99)
NonHDL: 139.74
Total CHOL/HDL Ratio: 5
Triglycerides: 85 mg/dL (ref 0.0–149.0)
VLDL: 17 mg/dL (ref 0.0–40.0)

## 2022-07-31 LAB — VITAMIN B12: Vitamin B-12: 309 pg/mL (ref 211–911)

## 2022-07-31 LAB — TSH: TSH: 1.4 u[IU]/mL (ref 0.35–5.50)

## 2022-07-31 LAB — VITAMIN D 25 HYDROXY (VIT D DEFICIENCY, FRACTURES): VITD: 12.37 ng/mL — ABNORMAL LOW (ref 30.00–100.00)

## 2022-07-31 MED ORDER — VITAMIN D (ERGOCALCIFEROL) 1.25 MG (50000 UNIT) PO CAPS
50000.0000 [IU] | ORAL_CAPSULE | ORAL | 0 refills | Status: DC
  Filled 2022-07-31: qty 12, 84d supply, fill #0

## 2022-08-01 ENCOUNTER — Other Ambulatory Visit (HOSPITAL_COMMUNITY): Payer: Self-pay

## 2022-08-02 LAB — DRUG MONITORING, PANEL 8 WITH CONFIRMATION, URINE
6 Acetylmorphine: NEGATIVE ng/mL (ref <10)
Alcohol Metabolites: NEGATIVE ng/mL (ref <500)
Amphetamine: 12825 ng/mL — ABNORMAL HIGH (ref <250)
Amphetamines: POSITIVE ng/mL — AB (ref <500)
Benzodiazepines: NEGATIVE ng/mL (ref <100)
Buprenorphine, Urine: NEGATIVE ng/mL (ref <5)
Cocaine Metabolite: NEGATIVE ng/mL (ref <150)
Codeine: NEGATIVE ng/mL (ref <50)
Creatinine: 183.2 mg/dL (ref >=20.0)
Hydrocodone: NEGATIVE ng/mL (ref <50)
Hydromorphone: 121 ng/mL — ABNORMAL HIGH (ref <50)
MDMA: NEGATIVE ng/mL (ref <500)
Marijuana Metabolite: NEGATIVE ng/mL (ref <20)
Methamphetamine: NEGATIVE ng/mL (ref <250)
Morphine: >10000 ng/mL — ABNORMAL HIGH (ref <50)
Norhydrocodone: NEGATIVE ng/mL (ref <50)
Opiates: POSITIVE ng/mL — AB (ref <100)
Oxidant: NEGATIVE ug/mL (ref <200)
Oxycodone: NEGATIVE ng/mL (ref <100)
pH: 5.9 (ref 4.5–9.0)

## 2022-08-02 LAB — DM TEMPLATE

## 2022-08-13 ENCOUNTER — Other Ambulatory Visit (HOSPITAL_COMMUNITY): Payer: Self-pay

## 2022-08-13 MED ORDER — MORPHINE SULFATE 15 MG PO TABS
15.0000 mg | ORAL_TABLET | Freq: Four times a day (QID) | ORAL | 0 refills | Status: DC | PRN
  Filled 2022-08-13: qty 120, 30d supply, fill #0

## 2022-08-29 ENCOUNTER — Other Ambulatory Visit: Payer: Self-pay | Admitting: Family Medicine

## 2022-08-29 ENCOUNTER — Other Ambulatory Visit (HOSPITAL_COMMUNITY): Payer: Self-pay

## 2022-08-29 DIAGNOSIS — M542 Cervicalgia: Secondary | ICD-10-CM

## 2022-08-29 MED ORDER — ETODOLAC 300 MG PO CAPS
300.0000 mg | ORAL_CAPSULE | Freq: Two times a day (BID) | ORAL | 3 refills | Status: DC | PRN
  Filled 2022-08-29: qty 60, 30d supply, fill #0
  Filled 2022-11-09: qty 60, 30d supply, fill #1
  Filled 2023-05-16: qty 60, 30d supply, fill #2
  Filled 2023-06-19: qty 60, 30d supply, fill #3

## 2022-08-29 MED ORDER — LEVOCETIRIZINE DIHYDROCHLORIDE 5 MG PO TABS
5.0000 mg | ORAL_TABLET | Freq: Every evening | ORAL | 0 refills | Status: DC
  Filled 2022-08-29: qty 90, 90d supply, fill #0

## 2022-08-30 ENCOUNTER — Other Ambulatory Visit (HOSPITAL_COMMUNITY): Payer: Self-pay

## 2022-09-11 ENCOUNTER — Other Ambulatory Visit: Payer: Self-pay

## 2022-09-11 ENCOUNTER — Other Ambulatory Visit (HOSPITAL_COMMUNITY): Payer: Self-pay

## 2022-09-11 MED ORDER — MORPHINE SULFATE 15 MG PO TABS
15.0000 mg | ORAL_TABLET | Freq: Four times a day (QID) | ORAL | 0 refills | Status: DC | PRN
  Filled 2022-09-11: qty 40, 10d supply, fill #0
  Filled 2022-09-11: qty 80, 20d supply, fill #0

## 2022-10-04 ENCOUNTER — Other Ambulatory Visit: Payer: Self-pay | Admitting: Family Medicine

## 2022-10-05 NOTE — Telephone Encounter (Signed)
Refill request for amphetamine-dextroamphetamine (ADDERALL) 15 MG tablet   LOV - 07/26/22 Next OV - not scheduled Last refill - 07/02/22 #60/0

## 2022-10-06 MED ORDER — AMPHETAMINE-DEXTROAMPHETAMINE 15 MG PO TABS
15.0000 mg | ORAL_TABLET | Freq: Two times a day (BID) | ORAL | 0 refills | Status: DC
  Filled 2022-10-06 – 2022-11-14 (×2): qty 60, 30d supply, fill #0

## 2022-10-06 MED ORDER — AMPHETAMINE-DEXTROAMPHETAMINE 15 MG PO TABS
1.0000 | ORAL_TABLET | Freq: Two times a day (BID) | ORAL | 0 refills | Status: DC
  Filled 2022-10-06: qty 60, 30d supply, fill #0

## 2022-10-06 MED ORDER — AMPHETAMINE-DEXTROAMPHETAMINE 15 MG PO TABS
15.0000 mg | ORAL_TABLET | Freq: Two times a day (BID) | ORAL | 0 refills | Status: DC
  Filled 2022-10-06 – 2022-12-20 (×2): qty 60, 30d supply, fill #0

## 2022-10-08 ENCOUNTER — Other Ambulatory Visit (HOSPITAL_COMMUNITY): Payer: Self-pay

## 2022-10-11 ENCOUNTER — Other Ambulatory Visit (HOSPITAL_COMMUNITY): Payer: Self-pay

## 2022-10-11 MED ORDER — MORPHINE SULFATE 15 MG PO TABS
15.0000 mg | ORAL_TABLET | Freq: Four times a day (QID) | ORAL | 0 refills | Status: DC | PRN
  Filled 2022-10-11: qty 120, 30d supply, fill #0

## 2022-11-09 ENCOUNTER — Other Ambulatory Visit: Payer: Self-pay | Admitting: Family Medicine

## 2022-11-09 NOTE — Telephone Encounter (Signed)
Refill request for amphetamine-dextroamphetamine (ADDERALL) 15 MG tablet   LOV - 07/26/22 Next OV - not scheduled Last refill - 10/06/22 #60/0 x2

## 2022-11-10 NOTE — Telephone Encounter (Signed)
Please check with patient/pharmacy.  She should have rxs on file at pharmacy.  Thanks.

## 2022-11-12 ENCOUNTER — Other Ambulatory Visit (HOSPITAL_COMMUNITY): Payer: Self-pay

## 2022-11-12 ENCOUNTER — Other Ambulatory Visit: Payer: Self-pay | Admitting: Family Medicine

## 2022-11-12 ENCOUNTER — Other Ambulatory Visit: Payer: Self-pay

## 2022-11-12 MED ORDER — MORPHINE SULFATE 15 MG PO TABS
15.0000 mg | ORAL_TABLET | Freq: Four times a day (QID) | ORAL | 0 refills | Status: DC | PRN
  Filled 2022-11-12: qty 120, 30d supply, fill #0

## 2022-11-12 NOTE — Telephone Encounter (Signed)
Duplicate request

## 2022-11-14 ENCOUNTER — Other Ambulatory Visit (HOSPITAL_COMMUNITY): Payer: Self-pay

## 2022-11-14 NOTE — Telephone Encounter (Signed)
Tried to call pharmacy several times this morning and have not been able to get thru. Patient should have a refill on file.

## 2022-12-14 ENCOUNTER — Other Ambulatory Visit (HOSPITAL_COMMUNITY): Payer: Self-pay

## 2022-12-15 ENCOUNTER — Other Ambulatory Visit (HOSPITAL_COMMUNITY): Payer: Self-pay

## 2022-12-15 MED ORDER — MORPHINE SULFATE 15 MG PO TABS
15.0000 mg | ORAL_TABLET | Freq: Four times a day (QID) | ORAL | 0 refills | Status: DC | PRN
  Filled 2022-12-15: qty 120, 30d supply, fill #0

## 2022-12-17 ENCOUNTER — Other Ambulatory Visit: Payer: Self-pay

## 2022-12-17 ENCOUNTER — Other Ambulatory Visit (HOSPITAL_COMMUNITY): Payer: Self-pay

## 2022-12-17 MED ORDER — MORPHINE SULFATE 15 MG PO TABS
15.0000 mg | ORAL_TABLET | Freq: Four times a day (QID) | ORAL | 0 refills | Status: DC | PRN
  Filled 2022-12-17: qty 120, 30d supply, fill #0

## 2022-12-19 ENCOUNTER — Other Ambulatory Visit (HOSPITAL_COMMUNITY): Payer: Self-pay

## 2022-12-20 ENCOUNTER — Other Ambulatory Visit (HOSPITAL_COMMUNITY): Payer: Self-pay

## 2023-01-08 DIAGNOSIS — Z79891 Long term (current) use of opiate analgesic: Secondary | ICD-10-CM | POA: Diagnosis not present

## 2023-01-08 DIAGNOSIS — M25519 Pain in unspecified shoulder: Secondary | ICD-10-CM | POA: Diagnosis not present

## 2023-01-08 DIAGNOSIS — G8929 Other chronic pain: Secondary | ICD-10-CM | POA: Diagnosis not present

## 2023-01-08 DIAGNOSIS — Z5181 Encounter for therapeutic drug level monitoring: Secondary | ICD-10-CM | POA: Diagnosis not present

## 2023-01-16 ENCOUNTER — Other Ambulatory Visit (HOSPITAL_COMMUNITY): Payer: Self-pay

## 2023-01-16 MED ORDER — MORPHINE SULFATE 15 MG PO TABS
15.0000 mg | ORAL_TABLET | Freq: Four times a day (QID) | ORAL | 0 refills | Status: DC | PRN
  Filled 2023-01-16: qty 120, 30d supply, fill #0

## 2023-01-17 ENCOUNTER — Other Ambulatory Visit (HOSPITAL_COMMUNITY): Payer: Self-pay

## 2023-01-27 ENCOUNTER — Other Ambulatory Visit: Payer: Self-pay | Admitting: Family Medicine

## 2023-01-28 ENCOUNTER — Other Ambulatory Visit (HOSPITAL_COMMUNITY): Payer: Self-pay

## 2023-01-28 ENCOUNTER — Other Ambulatory Visit: Payer: Self-pay

## 2023-01-28 MED ORDER — AMPHETAMINE-DEXTROAMPHETAMINE 15 MG PO TABS
1.0000 | ORAL_TABLET | Freq: Two times a day (BID) | ORAL | 0 refills | Status: DC
  Filled 2023-01-28: qty 60, 30d supply, fill #0

## 2023-01-28 MED ORDER — AMPHETAMINE-DEXTROAMPHETAMINE 15 MG PO TABS
15.0000 mg | ORAL_TABLET | Freq: Two times a day (BID) | ORAL | 0 refills | Status: DC
  Filled 2023-01-28 – 2023-03-15 (×2): qty 60, 30d supply, fill #0

## 2023-01-28 MED ORDER — LEVOCETIRIZINE DIHYDROCHLORIDE 5 MG PO TABS
5.0000 mg | ORAL_TABLET | Freq: Every evening | ORAL | 1 refills | Status: DC
  Filled 2023-01-28: qty 90, 90d supply, fill #0
  Filled 2023-05-17: qty 90, 90d supply, fill #1

## 2023-01-28 MED ORDER — AMPHETAMINE-DEXTROAMPHETAMINE 15 MG PO TABS
15.0000 mg | ORAL_TABLET | Freq: Two times a day (BID) | ORAL | 0 refills | Status: DC
  Filled 2023-01-28 – 2023-04-17 (×2): qty 60, 30d supply, fill #0

## 2023-01-28 NOTE — Telephone Encounter (Signed)
Please check with patient.  I thought this prescription was coming through outside clinic.  Thanks.

## 2023-01-28 NOTE — Telephone Encounter (Signed)
Refill request for   pregabalin (LYRICA) 75 MG capsule    LOV - 07/26/22 Next OV - not scheduled Last refill - 07/26/22 in as no print

## 2023-01-28 NOTE — Telephone Encounter (Signed)
Refill request for amphetamine-dextroamphetamine (ADDERALL) 15 MG tablet   LOV - 0/26/23 Next OV - not scheduled Last refill - 12/04/22 #60/0

## 2023-01-29 ENCOUNTER — Other Ambulatory Visit (HOSPITAL_COMMUNITY): Payer: Self-pay

## 2023-01-29 ENCOUNTER — Other Ambulatory Visit: Payer: Self-pay

## 2023-02-01 ENCOUNTER — Other Ambulatory Visit: Payer: Self-pay | Admitting: Family Medicine

## 2023-02-01 ENCOUNTER — Other Ambulatory Visit (HOSPITAL_COMMUNITY): Payer: Self-pay

## 2023-02-01 ENCOUNTER — Other Ambulatory Visit: Payer: Self-pay

## 2023-02-02 ENCOUNTER — Other Ambulatory Visit (HOSPITAL_COMMUNITY): Payer: Self-pay

## 2023-02-02 MED ORDER — PREGABALIN 75 MG PO CAPS
75.0000 mg | ORAL_CAPSULE | Freq: Two times a day (BID) | ORAL | 3 refills | Status: DC
  Filled 2023-02-02: qty 60, 30d supply, fill #0

## 2023-02-04 ENCOUNTER — Other Ambulatory Visit: Payer: Self-pay

## 2023-02-04 ENCOUNTER — Other Ambulatory Visit (HOSPITAL_COMMUNITY): Payer: Self-pay

## 2023-02-14 ENCOUNTER — Other Ambulatory Visit (HOSPITAL_COMMUNITY): Payer: Self-pay

## 2023-02-14 MED ORDER — MORPHINE SULFATE 15 MG PO TABS
15.0000 mg | ORAL_TABLET | Freq: Four times a day (QID) | ORAL | 0 refills | Status: DC | PRN
  Filled 2023-02-14 – 2023-02-15 (×2): qty 120, 30d supply, fill #0

## 2023-02-15 ENCOUNTER — Other Ambulatory Visit (HOSPITAL_COMMUNITY): Payer: Self-pay

## 2023-03-01 ENCOUNTER — Telehealth: Payer: 59 | Admitting: Physician Assistant

## 2023-03-01 ENCOUNTER — Other Ambulatory Visit (HOSPITAL_COMMUNITY): Payer: Self-pay

## 2023-03-01 DIAGNOSIS — J020 Streptococcal pharyngitis: Secondary | ICD-10-CM

## 2023-03-01 DIAGNOSIS — B379 Candidiasis, unspecified: Secondary | ICD-10-CM | POA: Diagnosis not present

## 2023-03-01 DIAGNOSIS — T3695XA Adverse effect of unspecified systemic antibiotic, initial encounter: Secondary | ICD-10-CM

## 2023-03-01 MED ORDER — AMOXICILLIN-POT CLAVULANATE 875-125 MG PO TABS
1.0000 | ORAL_TABLET | Freq: Two times a day (BID) | ORAL | 0 refills | Status: DC
  Filled 2023-03-01: qty 20, 10d supply, fill #0

## 2023-03-01 MED ORDER — FLUCONAZOLE 150 MG PO TABS
150.0000 mg | ORAL_TABLET | ORAL | 0 refills | Status: DC | PRN
Start: 2023-03-01 — End: 2023-07-29
  Filled 2023-03-01: qty 4, 12d supply, fill #0

## 2023-03-01 NOTE — Progress Notes (Signed)
E-Visit for Sore Throat - Strep Symptoms  We are sorry that you are not feeling well.  Here is how we plan to help!  Based on what you have shared with me it is likely that you have strep pharyngitis.  Strep pharyngitis is inflammation and infection in the back of the throat.  This is an infection cause by bacteria and is treated with antibiotics.  I have prescribed Augmentin 875-125mg  Take 1 tablet twice daily for 10 days. I have also prescribed Fluconazole. For throat pain, we recommend over the counter oral pain relief medications such as acetaminophen or aspirin, or anti-inflammatory medications such as ibuprofen or naproxen sodium. Topical treatments such as oral throat lozenges or sprays may be used as needed. Strep infections are not as easily transmitted as other respiratory infections, however we still recommend that you avoid close contact with loved ones, especially the very young and elderly.  Remember to wash your hands thoroughly throughout the day as this is the number one way to prevent the spread of infection and wipe down door knobs and counters with disinfectant.   Home Care: Only take medications as instructed by your medical team. Complete the entire course of an antibiotic. Do not take these medications with alcohol. A steam or ultrasonic humidifier can help congestion.  You can place a towel over your head and breathe in the steam from hot water coming from a faucet. Avoid close contacts especially the very young and the elderly. Cover your mouth when you cough or sneeze. Always remember to wash your hands.  Get Help Right Away If: You develop worsening fever or sinus pain. You develop a severe head ache or visual changes. Your symptoms persist after you have completed your treatment plan.  Make sure you Understand these instructions. Will watch your condition. Will get help right away if you are not doing well or get worse.   Thank you for choosing an  e-visit.  Your e-visit answers were reviewed by a board certified advanced clinical practitioner to complete your personal care plan. Depending upon the condition, your plan could have included both over the counter or prescription medications.  Please review your pharmacy choice. Make sure the pharmacy is open so you can pick up prescription now. If there is a problem, you may contact your provider through [employer] and have the prescription routed to another pharmacy.  Your safety is important to [place]. If you have drug allergies check your prescription carefully.   For the next 24 hours you can use MyChart to ask questions about today's visit, request a non-urgent call back, or ask for a work or school excuse. You will get an email in the next two days asking about your experience. I hope that your e-visit has been valuable and will speed your recovery.  I have spent 5 minutes in review of e-visit questionnaire, review and updating patient chart, medical decision making and response to patient.   Margaretann Loveless, PA-C

## 2023-03-15 ENCOUNTER — Other Ambulatory Visit (HOSPITAL_COMMUNITY): Payer: Self-pay

## 2023-03-18 ENCOUNTER — Other Ambulatory Visit: Payer: Self-pay

## 2023-03-18 ENCOUNTER — Other Ambulatory Visit (HOSPITAL_COMMUNITY): Payer: Self-pay

## 2023-03-18 MED ORDER — MORPHINE SULFATE 15 MG PO TABS
15.0000 mg | ORAL_TABLET | Freq: Four times a day (QID) | ORAL | 0 refills | Status: DC | PRN
  Filled 2023-03-18: qty 120, 30d supply, fill #0

## 2023-04-17 ENCOUNTER — Other Ambulatory Visit: Payer: Self-pay

## 2023-04-17 ENCOUNTER — Other Ambulatory Visit (HOSPITAL_COMMUNITY): Payer: Self-pay

## 2023-04-17 MED ORDER — MORPHINE SULFATE 15 MG PO TABS
15.0000 mg | ORAL_TABLET | Freq: Four times a day (QID) | ORAL | 0 refills | Status: DC | PRN
  Filled 2023-04-17 (×2): qty 120, 30d supply, fill #0

## 2023-04-18 ENCOUNTER — Other Ambulatory Visit (HOSPITAL_COMMUNITY): Payer: Self-pay

## 2023-05-14 ENCOUNTER — Other Ambulatory Visit (HOSPITAL_COMMUNITY): Payer: Self-pay

## 2023-05-14 DIAGNOSIS — M25519 Pain in unspecified shoulder: Secondary | ICD-10-CM | POA: Diagnosis not present

## 2023-05-14 DIAGNOSIS — G8929 Other chronic pain: Secondary | ICD-10-CM | POA: Diagnosis not present

## 2023-05-14 DIAGNOSIS — Z79891 Long term (current) use of opiate analgesic: Secondary | ICD-10-CM | POA: Diagnosis not present

## 2023-05-14 DIAGNOSIS — Z5181 Encounter for therapeutic drug level monitoring: Secondary | ICD-10-CM | POA: Diagnosis not present

## 2023-05-14 MED ORDER — MORPHINE SULFATE 15 MG PO TABS
15.0000 mg | ORAL_TABLET | Freq: Four times a day (QID) | ORAL | 0 refills | Status: DC | PRN
  Filled 2023-05-16: qty 120, 30d supply, fill #0

## 2023-05-16 ENCOUNTER — Telehealth: Payer: 59 | Admitting: Physician Assistant

## 2023-05-16 ENCOUNTER — Other Ambulatory Visit (HOSPITAL_COMMUNITY): Payer: Self-pay

## 2023-05-16 ENCOUNTER — Other Ambulatory Visit: Payer: Self-pay | Admitting: Family Medicine

## 2023-05-16 ENCOUNTER — Other Ambulatory Visit: Payer: Self-pay

## 2023-05-16 ENCOUNTER — Telehealth: Payer: Self-pay | Admitting: Family Medicine

## 2023-05-16 DIAGNOSIS — R3989 Other symptoms and signs involving the genitourinary system: Secondary | ICD-10-CM | POA: Diagnosis not present

## 2023-05-16 MED ORDER — NITROFURANTOIN MONOHYD MACRO 100 MG PO CAPS
100.0000 mg | ORAL_CAPSULE | Freq: Two times a day (BID) | ORAL | 0 refills | Status: DC
Start: 2023-05-16 — End: 2023-06-13
  Filled 2023-05-16: qty 10, 5d supply, fill #0

## 2023-05-16 NOTE — Telephone Encounter (Signed)
Patient has been scheduled. She stated that she will need a drug test done also for Dr. Ethelene Hal.

## 2023-05-16 NOTE — Telephone Encounter (Signed)
Prescription Request  05/16/2023  LOV: 07/26/2022  What is the name of the medication or equipment? Alprazolam  Have you contacted your pharmacy to request a refill? Yes   Which pharmacy would you like this sent to?  [facility] [address] [place] [address] Phone: [phone] Fax: [phone]     Patient notified that their request is being sent to the clinical staff for review and that they should receive a response within 2 business days.   Please advise at [facility] [phone]

## 2023-05-16 NOTE — Telephone Encounter (Signed)
Patient needs CPE scheduled for oct; please call patient to schedule.

## 2023-05-16 NOTE — Telephone Encounter (Signed)
LOV - 07/26/22 NOV - 07/29/23 RF - 07/26/22 #30/1

## 2023-05-16 NOTE — Progress Notes (Signed)

## 2023-05-17 ENCOUNTER — Other Ambulatory Visit: Payer: Self-pay

## 2023-05-17 ENCOUNTER — Other Ambulatory Visit (HOSPITAL_COMMUNITY): Payer: Self-pay

## 2023-05-17 ENCOUNTER — Other Ambulatory Visit: Payer: Self-pay | Admitting: Family Medicine

## 2023-05-17 MED ORDER — ALPRAZOLAM 0.5 MG PO TABS
0.2500 mg | ORAL_TABLET | Freq: Two times a day (BID) | ORAL | 1 refills | Status: DC | PRN
  Filled 2023-05-17: qty 30, 15d supply, fill #0
  Filled 2023-07-30: qty 30, 15d supply, fill #1

## 2023-05-17 MED ORDER — VENLAFAXINE HCL ER 150 MG PO CP24
150.0000 mg | ORAL_CAPSULE | Freq: Every day | ORAL | 1 refills | Status: DC
  Filled 2023-05-17: qty 90, 90d supply, fill #0
  Filled 2023-08-15: qty 90, 90d supply, fill #1

## 2023-05-17 NOTE — Telephone Encounter (Signed)
Refill request for   amphetamine-dextroamphetamine (ADDERALL) 15 MG tablet    LOV - 07/26/22 Next OV - 07/29/23 Last refill - 01/28/23 #60/0 x 3

## 2023-05-17 NOTE — Telephone Encounter (Signed)
Sent. Thanks.   

## 2023-05-17 NOTE — Addendum Note (Signed)
Addended by: [name] on: 05/17/2023 07:03 AM   Modules accepted: Orders

## 2023-05-17 NOTE — Telephone Encounter (Signed)
Patient needs drug screen done with her blood work when ordered.

## 2023-05-19 MED ORDER — AMPHETAMINE-DEXTROAMPHETAMINE 15 MG PO TABS
15.0000 mg | ORAL_TABLET | Freq: Two times a day (BID) | ORAL | 0 refills | Status: DC
  Filled 2023-05-19: qty 60, 30d supply, fill #0

## 2023-05-19 NOTE — Telephone Encounter (Signed)
Sent. Thanks.   

## 2023-05-20 ENCOUNTER — Other Ambulatory Visit (HOSPITAL_COMMUNITY): Payer: Self-pay

## 2023-05-20 ENCOUNTER — Other Ambulatory Visit: Payer: Self-pay

## 2023-06-13 ENCOUNTER — Telehealth: Payer: 59 | Admitting: Physician Assistant

## 2023-06-13 ENCOUNTER — Other Ambulatory Visit (HOSPITAL_COMMUNITY): Payer: Self-pay

## 2023-06-13 DIAGNOSIS — J019 Acute sinusitis, unspecified: Secondary | ICD-10-CM | POA: Diagnosis not present

## 2023-06-13 DIAGNOSIS — B9689 Other specified bacterial agents as the cause of diseases classified elsewhere: Secondary | ICD-10-CM | POA: Diagnosis not present

## 2023-06-13 MED ORDER — AMOXICILLIN-POT CLAVULANATE 875-125 MG PO TABS
1.0000 | ORAL_TABLET | Freq: Two times a day (BID) | ORAL | 0 refills | Status: DC
Start: 2023-06-13 — End: 2023-07-12
  Filled 2023-06-13: qty 20, 10d supply, fill #0

## 2023-06-13 NOTE — Progress Notes (Signed)
E-Visit for Sinus Problems  We are sorry that you are not feeling well.  Here is how we plan to help!  Based on what you have shared with me it looks like you have sinusitis.  Sinusitis is inflammation and infection in the sinus cavities of the head.  Based on your presentation I believe you most likely have Acute Bacterial Sinusitis.  This is an infection caused by bacteria and is treated with antibiotics. I have prescribed  Augmentin 875mg/125mg one tablet twice daily with food, for 10 days.You may use an oral decongestant such as Mucinex D or if you have glaucoma or high blood pressure use plain Mucinex. Saline nasal spray help and can safely be used as often as needed for congestion.  If you develop worsening sinus pain, fever or notice severe headache and vision changes, or if symptoms are not better after completion of antibiotic, please schedule an appointment with a health care provider.    Sinus infections are not as easily transmitted as other respiratory infection, however we still recommend that you avoid close contact with loved ones, especially the very young and elderly.  Remember to wash your hands thoroughly throughout the day as this is the number one way to prevent the spread of infection!  Home Care: Only take medications as instructed by your medical team. Complete the entire course of an antibiotic. Do not take these medications with alcohol. A steam or ultrasonic humidifier can help congestion.  You can place a towel over your head and breathe in the steam from hot water coming from a faucet. Avoid close contacts especially the very young and the elderly. Cover your mouth when you cough or sneeze. Always remember to wash your hands.  Get Help Right Away If: You develop worsening fever or sinus pain. You develop a severe head ache or visual changes. Your symptoms persist after you have completed your treatment plan.  Make sure you Understand these instructions. Will  watch your condition. Will get help right away if you are not doing well or get worse.  Thank you for choosing an e-visit.  Your e-visit answers were reviewed by a board certified advanced clinical practitioner to complete your personal care plan. Depending upon the condition, your plan could have included both over the counter or prescription medications.  Please review your pharmacy choice. Make sure the pharmacy is open so you can pick up prescription now. If there is a problem, you may contact your provider through MyChart messaging and have the prescription routed to another pharmacy.  Your safety is important to us. If you have drug allergies check your prescription carefully.   For the next 24 hours you can use MyChart to ask questions about today's visit, request a non-urgent call back, or ask for a work or school excuse. You will get an email in the next two days asking about your experience. I hope that your e-visit has been valuable and will speed your recovery.  I have spent 5 minutes in review of e-visit questionnaire, review and updating patient chart, medical decision making and response to patient.   Jennifer M Burnette, PA-C  

## 2023-06-17 ENCOUNTER — Other Ambulatory Visit (HOSPITAL_COMMUNITY): Payer: Self-pay

## 2023-06-17 MED ORDER — MORPHINE SULFATE 15 MG PO TABS
15.0000 mg | ORAL_TABLET | Freq: Four times a day (QID) | ORAL | 0 refills | Status: DC | PRN
Start: 2023-06-17 — End: 2023-07-16
  Filled 2023-06-17: qty 120, 30d supply, fill #0

## 2023-06-19 ENCOUNTER — Other Ambulatory Visit (HOSPITAL_COMMUNITY): Payer: Self-pay

## 2023-06-19 ENCOUNTER — Other Ambulatory Visit: Payer: Self-pay

## 2023-07-12 ENCOUNTER — Other Ambulatory Visit (HOSPITAL_COMMUNITY): Payer: Self-pay

## 2023-07-12 ENCOUNTER — Telehealth: Payer: 59 | Admitting: Emergency Medicine

## 2023-07-12 DIAGNOSIS — R3989 Other symptoms and signs involving the genitourinary system: Secondary | ICD-10-CM | POA: Diagnosis not present

## 2023-07-12 MED ORDER — CEPHALEXIN 500 MG PO CAPS
500.0000 mg | ORAL_CAPSULE | Freq: Two times a day (BID) | ORAL | 0 refills | Status: AC
  Filled 2023-07-12: qty 14, 7d supply, fill #0

## 2023-07-12 NOTE — Progress Notes (Signed)
E-Visit for Urinary Problems ? ?We are sorry that you are not feeling well.  Here is how we plan to help! ? ?Based on what you shared with me it looks like you most likely have a simple urinary tract infection. ? ?A UTI (Urinary Tract Infection) is a bacterial infection of the bladder. ? ?Most cases of urinary tract infections are simple to treat but a key part of your care is to encourage you to drink plenty of fluids and watch your symptoms carefully. ? ?I have prescribed Keflex 500 mg twice a day for 7 days.  Your symptoms should gradually improve. Call us if the burning in your urine worsens, you develop worsening fever, back pain or pelvic pain or if your symptoms do not resolve after completing the antibiotic. ? ?Urinary tract infections can be prevented by drinking plenty of water to keep your body hydrated.  Also be sure when you wipe, wipe from front to back and don't hold it in!  If possible, empty your bladder every 4 hours. ? ?HOME CARE ?Drink plenty of fluids ?Compete the full course of the antibiotics even if the symptoms resolve ?Remember, when you need to go?go. Holding in your urine can increase the likelihood of getting a UTI! ?GET HELP RIGHT AWAY IF: ?You cannot urinate ?You get a high fever ?Worsening back pain occurs ?You see blood in your urine ?You feel sick to your stomach or throw up ?You feel like you are going to pass out ? ?MAKE SURE YOU  ?Understand these instructions. ?Will watch your condition. ?Will get help right away if you are not doing well or get worse. ? ? ?Thank you for choosing an e-visit. ? ?Your e-visit answers were reviewed by a board certified advanced clinical practitioner to complete your personal care plan. Depending upon the condition, your plan could have included both over the counter or prescription medications. ? ?Please review your pharmacy choice. Make sure the pharmacy is open so you can pick up prescription now. If there is a problem, you may contact your  provider through MyChart messaging and have the prescription routed to another pharmacy.  Your safety is important to us. If you have drug allergies check your prescription carefully.  ? ?For the next 24 hours you can use MyChart to ask questions about today's visit, request a non-urgent call back, or ask for a work or school excuse. ?You will get an email in the next two days asking about your experience. I hope that your e-visit has been valuable and will speed your recovery. ? ?I have spent 5 minutes in review of e-visit questionnaire, review and updating patient chart, medical decision making and response to patient.  ? ?Regena Delucchi, PhD, FNP-BC ?  ?

## 2023-07-16 ENCOUNTER — Other Ambulatory Visit (HOSPITAL_COMMUNITY): Payer: Self-pay

## 2023-07-16 MED ORDER — MORPHINE SULFATE 15 MG PO TABS
15.0000 mg | ORAL_TABLET | Freq: Four times a day (QID) | ORAL | 0 refills | Status: DC | PRN
Start: 2023-07-16 — End: 2023-08-15
  Filled 2023-07-16: qty 120, 30d supply, fill #0

## 2023-07-21 ENCOUNTER — Other Ambulatory Visit: Payer: Self-pay | Admitting: Family Medicine

## 2023-07-21 DIAGNOSIS — E538 Deficiency of other specified B group vitamins: Secondary | ICD-10-CM

## 2023-07-21 DIAGNOSIS — E785 Hyperlipidemia, unspecified: Secondary | ICD-10-CM

## 2023-07-21 DIAGNOSIS — E559 Vitamin D deficiency, unspecified: Secondary | ICD-10-CM

## 2023-07-23 ENCOUNTER — Other Ambulatory Visit: Payer: 59

## 2023-07-23 DIAGNOSIS — E559 Vitamin D deficiency, unspecified: Secondary | ICD-10-CM | POA: Diagnosis not present

## 2023-07-23 DIAGNOSIS — E785 Hyperlipidemia, unspecified: Secondary | ICD-10-CM

## 2023-07-23 DIAGNOSIS — E538 Deficiency of other specified B group vitamins: Secondary | ICD-10-CM | POA: Diagnosis not present

## 2023-07-23 LAB — CBC WITH DIFFERENTIAL/PLATELET
Basophils Absolute: 0.1 10*3/uL (ref 0.0–0.1)
Basophils Relative: 0.7 % (ref 0.0–3.0)
Eosinophils Absolute: 0.4 10*3/uL (ref 0.0–0.7)
Eosinophils Relative: 5.1 % — ABNORMAL HIGH (ref 0.0–5.0)
HCT: 45.7 % (ref 36.0–46.0)
Hemoglobin: 15 g/dL (ref 12.0–15.0)
Lymphocytes Relative: 29.5 % (ref 12.0–46.0)
Lymphs Abs: 2.5 10*3/uL (ref 0.7–4.0)
MCHC: 32.7 g/dL (ref 30.0–36.0)
MCV: 86.6 fL (ref 78.0–100.0)
Monocytes Absolute: 0.6 10*3/uL (ref 0.1–1.0)
Monocytes Relative: 6.8 % (ref 3.0–12.0)
Neutro Abs: 4.9 10*3/uL (ref 1.4–7.7)
Neutrophils Relative %: 57.9 % (ref 43.0–77.0)
Platelets: 301 10*3/uL (ref 150.0–400.0)
RBC: 5.28 Mil/uL — ABNORMAL HIGH (ref 3.87–5.11)
RDW: 13.3 % (ref 11.5–15.5)
WBC: 8.5 10*3/uL (ref 4.0–10.5)

## 2023-07-23 LAB — COMPREHENSIVE METABOLIC PANEL
ALT: 14 U/L (ref 0–35)
AST: 17 U/L (ref 0–37)
Albumin: 4.3 g/dL (ref 3.5–5.2)
Alkaline Phosphatase: 52 U/L (ref 39–117)
BUN: 9 mg/dL (ref 6–23)
CO2: 26 meq/L (ref 19–32)
Calcium: 9.4 mg/dL (ref 8.4–10.5)
Chloride: 104 meq/L (ref 96–112)
Creatinine, Ser: 1.04 mg/dL (ref 0.40–1.20)
GFR: 65.61 mL/min (ref >60.00)
Glucose, Bld: 130 mg/dL — ABNORMAL HIGH (ref 70–99)
Potassium: 4.7 meq/L (ref 3.5–5.1)
Sodium: 140 meq/L (ref 135–145)
Total Bilirubin: 0.4 mg/dL (ref 0.2–1.2)
Total Protein: 7.2 g/dL (ref 6.0–8.3)

## 2023-07-23 LAB — LIPID PANEL
Cholesterol: 165 mg/dL (ref 0–200)
HDL: 38.7 mg/dL — ABNORMAL LOW (ref >39.00)
LDL Cholesterol: 106 mg/dL — ABNORMAL HIGH (ref 0–99)
NonHDL: 126.21
Total CHOL/HDL Ratio: 4
Triglycerides: 100 mg/dL (ref 0.0–149.0)
VLDL: 20 mg/dL (ref 0.0–40.0)

## 2023-07-23 LAB — VITAMIN D 25 HYDROXY (VIT D DEFICIENCY, FRACTURES): VITD: 10.54 ng/mL — ABNORMAL LOW (ref 30.00–100.00)

## 2023-07-23 LAB — VITAMIN B12: Vitamin B-12: 387 pg/mL (ref 211–911)

## 2023-07-23 LAB — TSH: TSH: 2.08 u[IU]/mL (ref 0.35–5.50)

## 2023-07-29 ENCOUNTER — Encounter: Payer: Self-pay | Admitting: Family Medicine

## 2023-07-29 ENCOUNTER — Ambulatory Visit (INDEPENDENT_AMBULATORY_CARE_PROVIDER_SITE_OTHER): Payer: 59 | Admitting: Family Medicine

## 2023-07-29 ENCOUNTER — Other Ambulatory Visit (HOSPITAL_COMMUNITY): Payer: Self-pay

## 2023-07-29 VITALS — BP 114/62 | HR 89 | Temp 98.2°F | Ht 65.0 in | Wt 230.8 lb

## 2023-07-29 DIAGNOSIS — Z0000 Encounter for general adult medical examination without abnormal findings: Secondary | ICD-10-CM

## 2023-07-29 DIAGNOSIS — F988 Other specified behavioral and emotional disorders with onset usually occurring in childhood and adolescence: Secondary | ICD-10-CM

## 2023-07-29 DIAGNOSIS — E559 Vitamin D deficiency, unspecified: Secondary | ICD-10-CM

## 2023-07-29 DIAGNOSIS — E538 Deficiency of other specified B group vitamins: Secondary | ICD-10-CM

## 2023-07-29 DIAGNOSIS — F4320 Adjustment disorder, unspecified: Secondary | ICD-10-CM

## 2023-07-29 DIAGNOSIS — M549 Dorsalgia, unspecified: Secondary | ICD-10-CM | POA: Diagnosis not present

## 2023-07-29 DIAGNOSIS — G8929 Other chronic pain: Secondary | ICD-10-CM

## 2023-07-29 DIAGNOSIS — M542 Cervicalgia: Secondary | ICD-10-CM

## 2023-07-29 DIAGNOSIS — Z7189 Other specified counseling: Secondary | ICD-10-CM

## 2023-07-29 MED ORDER — VENLAFAXINE HCL ER 75 MG PO CP24
75.0000 mg | ORAL_CAPSULE | Freq: Every day | ORAL | 1 refills | Status: DC
  Filled 2023-07-29: qty 90, 90d supply, fill #0
  Filled 2023-11-14: qty 90, 90d supply, fill #1

## 2023-07-29 MED ORDER — PREGABALIN 75 MG PO CAPS: ORAL_CAPSULE | ORAL | Status: AC

## 2023-07-29 MED ORDER — ETODOLAC 300 MG PO CAPS
300.0000 mg | ORAL_CAPSULE | Freq: Two times a day (BID) | ORAL | 3 refills | Status: DC | PRN
  Filled 2023-07-29: qty 60, 30d supply, fill #0
  Filled 2023-11-14: qty 60, 30d supply, fill #1
  Filled 2024-03-13: qty 60, 30d supply, fill #2
  Filled 2024-05-27: qty 60, 30d supply, fill #3

## 2023-07-29 MED ORDER — METRONIDAZOLE 1 % EX GEL
1.0000 | Freq: Every day | CUTANEOUS | 3 refills | Status: AC
  Filled 2023-07-29: qty 60, 30d supply, fill #0

## 2023-07-29 MED ORDER — AMPHETAMINE-DEXTROAMPHETAMINE 15 MG PO TABS
15.0000 mg | ORAL_TABLET | Freq: Two times a day (BID) | ORAL | 0 refills | Status: DC
  Filled 2023-07-29: qty 60, 30d supply, fill #0

## 2023-07-29 MED ORDER — CYANOCOBALAMIN 1000 MCG/ML IJ SOLN
1000.0000 ug | INTRAMUSCULAR | 3 refills | Status: AC
  Filled 2023-07-29 (×2): qty 3, 90d supply, fill #0

## 2023-07-29 MED ORDER — AMPHETAMINE-DEXTROAMPHETAMINE 15 MG PO TABS
15.0000 mg | ORAL_TABLET | Freq: Two times a day (BID) | ORAL | 0 refills | Status: DC
  Filled 2023-07-29 – 2023-09-13 (×2): qty 60, 30d supply, fill #0

## 2023-07-29 MED ORDER — VITAMIN D3 50 MCG (2000 UT) PO CAPS: ORAL_CAPSULE | ORAL | Status: DC

## 2023-07-29 MED ORDER — BD INSULIN SYRINGE 25G X 1" 1 ML MISC
3 refills | Status: AC
  Filled 2023-07-29: qty 10, 70d supply, fill #0

## 2023-07-29 MED ORDER — AMPHETAMINE-DEXTROAMPHETAMINE 15 MG PO TABS
15.0000 mg | ORAL_TABLET | Freq: Two times a day (BID) | ORAL | 0 refills | Status: DC
  Filled 2023-07-29 – 2023-10-14 (×2): qty 60, 30d supply, fill #0

## 2023-07-29 NOTE — Progress Notes (Unsigned)
CPE- See plan.  Routine anticipatory guidance given to patient.  See health maintenance.  The possibility exists that previously documented standard health maintenance information may have been brought forward from a previous encounter into this note.  If needed, that same information has been updated to reflect the current situation based on today's encounter.    Pap and DXA not due. Mammogram d/w pt.  Encouraged.   Colon cancer screening not due tdap 2015 covid vaccine 2021 Flu shot done at work yearly PNA and shingles not due Living will d/w pt- she would have her mother designated if patient were incapacitated. Diet and exercise d/w pt. HIV screen neg ~2002 per patient.   HCV prev done with prenatal labs.    She was not fasting for labs and that would explain her glucose level.  D/w pt.    On morphine per outside clinic.  Taking lyrica every other day as needed- higher dose caused swelling.  She is trying to put off surgery.  UDS pending.  See notes on labs.    ADD and mood d/w pt.  Attention is variable, but manageable.  Still on adderall at baseline.  No ADE on med.  Fatigue noted.  Tearful.  No SI/HI.    B12 on replacement.  Compliant.  B12 level normal.    Had been off vit D replacement.  She didn't know if the higher dose was causing headaches.  D/w pt about options.  See AVS.    PMH and SH reviewed  Meds, vitals, and allergies reviewed.   ROS: Per HPI.  Unless specifically indicated otherwise in HPI, the patient denies:  General: fever. Eyes: acute vision changes ENT: sore throat Cardiovascular: chest pain Respiratory: SOB GI: vomiting GU: dysuria Musculoskeletal: acute back pain Derm: acute rash Neuro: acute motor dysfunction Psych: worsening mood Endocrine: polydipsia Heme: bleeding Allergy: hayfever  GEN: nad, alert and oriented HEENT: ncat NECK: supple w/o LA CV: rrr. PULM: ctab, no inc wob ABD: soft, +bs EXT: no edema SKIN: Well-perfused Tearful but  regains composure.  Not sedated.  Judgment intact.

## 2023-07-29 NOTE — Patient Instructions (Addendum)
You can call for a mammogram at the [facility] [address] [place] [address] [place]  Add on extra vitamin D as tolerated.  Add on 75 mg effexor, total 225mg  per day.  Please update me in about 2 weeks, sooner if needed.  Take care.  Glad to see you.

## 2023-07-30 ENCOUNTER — Other Ambulatory Visit (HOSPITAL_COMMUNITY): Payer: Self-pay

## 2023-07-30 ENCOUNTER — Other Ambulatory Visit: Payer: Self-pay

## 2023-07-31 ENCOUNTER — Other Ambulatory Visit (HOSPITAL_COMMUNITY): Payer: Self-pay

## 2023-07-31 LAB — DRUG MONITORING, PANEL 8 WITH CONFIRMATION, URINE
6 Acetylmorphine: NEGATIVE ng/mL (ref <10)
Alcohol Metabolites: NEGATIVE ng/mL (ref <500)
Alphahydroxyalprazolam: 221 ng/mL — ABNORMAL HIGH (ref <25)
Alphahydroxymidazolam: NEGATIVE ng/mL (ref <50)
Alphahydroxytriazolam: NEGATIVE ng/mL (ref <50)
Aminoclonazepam: NEGATIVE ng/mL (ref <25)
Amphetamine: >15000 ng/mL — ABNORMAL HIGH (ref <250)
Amphetamines: POSITIVE ng/mL — AB (ref <500)
Benzodiazepines: POSITIVE ng/mL — AB (ref <100)
Buprenorphine, Urine: NEGATIVE ng/mL (ref <5)
Cocaine Metabolite: NEGATIVE ng/mL (ref <150)
Codeine: NEGATIVE ng/mL (ref <50)
Creatinine: 255.2 mg/dL (ref >=20.0)
Hydrocodone: NEGATIVE ng/mL (ref <50)
Hydromorphone: 209 ng/mL — ABNORMAL HIGH (ref <50)
Hydroxyethylflurazepam: NEGATIVE ng/mL (ref <50)
Lorazepam: NEGATIVE ng/mL (ref <50)
MDMA: NEGATIVE ng/mL (ref <500)
Marijuana Metabolite: NEGATIVE ng/mL (ref <20)
Methamphetamine: NEGATIVE ng/mL (ref <250)
Morphine: >10000 ng/mL — ABNORMAL HIGH (ref <50)
Nordiazepam: NEGATIVE ng/mL (ref <50)
Norhydrocodone: NEGATIVE ng/mL (ref <50)
Opiates: POSITIVE ng/mL — AB (ref <100)
Oxazepam: NEGATIVE ng/mL (ref <50)
Oxidant: NEGATIVE ug/mL (ref <200)
Oxycodone: NEGATIVE ng/mL (ref <100)
Temazepam: NEGATIVE ng/mL (ref <50)
pH: 8.1 (ref 4.5–9.0)

## 2023-07-31 LAB — DM TEMPLATE

## 2023-08-01 ENCOUNTER — Other Ambulatory Visit (HOSPITAL_COMMUNITY): Payer: Self-pay

## 2023-08-01 NOTE — Assessment & Plan Note (Signed)
Living will d/w pt- she would have her mother designated if patient were incapacitated. 

## 2023-08-01 NOTE — Assessment & Plan Note (Signed)
Pap and DXA not due. Mammogram d/w pt.  Encouraged.   Colon cancer screening not due tdap 2015 covid vaccine 2021 Flu shot done at work yearly PNA and shingles not due Living will d/w pt- she would have her mother designated if patient were incapacitated. Diet and exercise d/w pt. HIV screen neg ~2002 per patient.   HCV prev done with prenatal labs.   

## 2023-08-01 NOTE — Assessment & Plan Note (Signed)
Had been off vit D replacement.  She didn't know if the higher dose was causing headaches.  D/w pt about options.  See AVS.

## 2023-08-01 NOTE — Assessment & Plan Note (Signed)
Not sedated.  Per outside clinic.  See notes on UDS.

## 2023-08-01 NOTE — Assessment & Plan Note (Signed)
With fatigue, tearfulness at a lower mood noted.  Okay for outpatient follow-up.  Currently taking 150 mg Effexor.  Add on 75 mg effexor, total 225mg  per day.  I asked her to update me in about 2 weeks, sooner if needed.

## 2023-08-01 NOTE — Assessment & Plan Note (Signed)
Would continue Adderall as is.  It helped with concentration.

## 2023-08-01 NOTE — Assessment & Plan Note (Signed)
Continue B12 replacement as is.

## 2023-08-15 ENCOUNTER — Other Ambulatory Visit: Payer: Self-pay

## 2023-08-15 ENCOUNTER — Other Ambulatory Visit (HOSPITAL_COMMUNITY): Payer: Self-pay

## 2023-08-15 ENCOUNTER — Other Ambulatory Visit: Payer: Self-pay | Admitting: Family Medicine

## 2023-08-15 MED ORDER — MORPHINE SULFATE 15 MG PO TABS
15.0000 mg | ORAL_TABLET | Freq: Four times a day (QID) | ORAL | 0 refills | Status: DC | PRN
  Filled 2023-08-15: qty 120, 30d supply, fill #0

## 2023-08-15 NOTE — Telephone Encounter (Signed)
Last office visit: 07/29/23 Next office visit: nothing scheduled Last refill: 01/28/2023 levocetirizine (XYZAL) 5 MG tablet 90 tablets with 1 efill

## 2023-08-16 ENCOUNTER — Other Ambulatory Visit (HOSPITAL_COMMUNITY): Payer: Self-pay

## 2023-08-16 MED ORDER — LEVOCETIRIZINE DIHYDROCHLORIDE 5 MG PO TABS
5.0000 mg | ORAL_TABLET | Freq: Every evening | ORAL | 3 refills | Status: AC
  Filled 2023-08-16: qty 90, 90d supply, fill #0
  Filled 2024-01-23: qty 90, 90d supply, fill #1
  Filled 2024-05-20: qty 90, 90d supply, fill #2

## 2023-09-13 ENCOUNTER — Other Ambulatory Visit: Payer: Self-pay

## 2023-09-13 ENCOUNTER — Other Ambulatory Visit (HOSPITAL_COMMUNITY): Payer: Self-pay

## 2023-09-13 MED ORDER — MORPHINE SULFATE 15 MG PO TABS
15.0000 mg | ORAL_TABLET | Freq: Four times a day (QID) | ORAL | 0 refills | Status: DC | PRN
  Filled 2023-09-13: qty 120, 30d supply, fill #0

## 2023-10-14 ENCOUNTER — Other Ambulatory Visit (HOSPITAL_COMMUNITY): Payer: Self-pay

## 2023-10-14 ENCOUNTER — Other Ambulatory Visit: Payer: Self-pay

## 2023-10-15 ENCOUNTER — Other Ambulatory Visit (HOSPITAL_COMMUNITY): Payer: Self-pay

## 2023-10-15 MED ORDER — MORPHINE SULFATE 15 MG PO TABS
15.0000 mg | ORAL_TABLET | Freq: Four times a day (QID) | ORAL | 0 refills | Status: DC | PRN
  Filled 2023-10-15: qty 120, 30d supply, fill #0

## 2023-10-18 DIAGNOSIS — Z79891 Long term (current) use of opiate analgesic: Secondary | ICD-10-CM | POA: Diagnosis not present

## 2023-10-18 DIAGNOSIS — M51369 Other intervertebral disc degeneration, lumbar region without mention of lumbar back pain or lower extremity pain: Secondary | ICD-10-CM | POA: Diagnosis not present

## 2023-11-14 ENCOUNTER — Other Ambulatory Visit: Payer: Self-pay | Admitting: Family Medicine

## 2023-11-14 ENCOUNTER — Other Ambulatory Visit (HOSPITAL_COMMUNITY): Payer: Self-pay

## 2023-11-14 MED ORDER — MORPHINE SULFATE 15 MG PO TABS
15.0000 mg | ORAL_TABLET | Freq: Four times a day (QID) | ORAL | 0 refills | Status: DC | PRN
  Filled 2023-11-14: qty 120, 30d supply, fill #0

## 2023-11-15 ENCOUNTER — Other Ambulatory Visit (HOSPITAL_COMMUNITY): Payer: Self-pay

## 2023-11-15 ENCOUNTER — Other Ambulatory Visit: Payer: Self-pay

## 2023-11-15 NOTE — Telephone Encounter (Signed)
Last refill:   venlafaxine XR (EFFEXOR-XR) 150 MG 24 hr capsule 05/17/23 90 capsules 1 refill amphetamine-dextroamphetamine (ADDERALL) 15 MG tablet 09/27/23 60 tablets 0 refills Last office visit: 07/29/23 Next office visit: nothing scheduled

## 2023-11-17 ENCOUNTER — Other Ambulatory Visit (HOSPITAL_COMMUNITY): Payer: Self-pay

## 2023-11-17 MED ORDER — VENLAFAXINE HCL ER 150 MG PO CP24
150.0000 mg | ORAL_CAPSULE | Freq: Every day | ORAL | 1 refills | Status: DC
  Filled 2023-11-17: qty 90, 90d supply, fill #0
  Filled 2024-05-20: qty 90, 90d supply, fill #1

## 2023-11-17 MED ORDER — AMPHETAMINE-DEXTROAMPHETAMINE 15 MG PO TABS
15.0000 mg | ORAL_TABLET | Freq: Two times a day (BID) | ORAL | 0 refills | Status: DC
  Filled 2023-11-17: qty 60, 30d supply, fill #0

## 2023-11-17 MED ORDER — AMPHETAMINE-DEXTROAMPHETAMINE 15 MG PO TABS
15.0000 mg | ORAL_TABLET | Freq: Two times a day (BID) | ORAL | 0 refills | Status: DC
  Filled 2023-11-17 – 2024-01-24 (×2): qty 60, 30d supply, fill #0

## 2023-11-17 MED ORDER — AMPHETAMINE-DEXTROAMPHETAMINE 15 MG PO TABS
15.0000 mg | ORAL_TABLET | Freq: Two times a day (BID) | ORAL | 0 refills | Status: DC
  Filled 2023-11-17 – 2023-12-23 (×2): qty 60, 30d supply, fill #0

## 2023-11-17 NOTE — Telephone Encounter (Signed)
 Sent. Thanks.

## 2023-11-18 ENCOUNTER — Other Ambulatory Visit (HOSPITAL_COMMUNITY): Payer: Self-pay

## 2023-12-19 ENCOUNTER — Other Ambulatory Visit: Payer: Self-pay | Admitting: Family Medicine

## 2023-12-19 NOTE — Telephone Encounter (Signed)
 LOV:07/09/23 NOV: NOTHING SCHEDULED LAST REFILL: ALPRAZolam (XANAX) 0.5 MG tablet 05/17/23 30 TABLETS 1 REFILL

## 2023-12-20 ENCOUNTER — Other Ambulatory Visit (HOSPITAL_COMMUNITY): Payer: Self-pay

## 2023-12-20 MED ORDER — ALPRAZOLAM 0.5 MG PO TABS
0.2500 mg | ORAL_TABLET | Freq: Two times a day (BID) | ORAL | 1 refills | Status: DC | PRN
  Filled 2023-12-20: qty 30, 15d supply, fill #0
  Filled 2024-03-31: qty 30, 15d supply, fill #1

## 2023-12-20 NOTE — Telephone Encounter (Signed)
 Sent.  Please get update on patient regarding mood.  Thanks.

## 2023-12-23 ENCOUNTER — Other Ambulatory Visit (HOSPITAL_COMMUNITY): Payer: Self-pay

## 2023-12-23 ENCOUNTER — Other Ambulatory Visit: Payer: Self-pay

## 2023-12-23 MED ORDER — MORPHINE SULFATE 15 MG PO TABS
15.0000 mg | ORAL_TABLET | Freq: Four times a day (QID) | ORAL | 0 refills | Status: DC | PRN
  Filled 2023-12-23: qty 120, 30d supply, fill #0

## 2023-12-24 NOTE — Telephone Encounter (Signed)
 Patient states that her mood has improved since she last saw you. That she has been better.

## 2023-12-25 NOTE — Telephone Encounter (Signed)
 Noted. Thanks.

## 2024-01-22 ENCOUNTER — Other Ambulatory Visit (HOSPITAL_COMMUNITY): Payer: Self-pay

## 2024-01-22 MED ORDER — MORPHINE SULFATE 15 MG PO TABS
15.0000 mg | ORAL_TABLET | Freq: Four times a day (QID) | ORAL | 0 refills | Status: DC | PRN
  Filled 2024-01-22 – 2024-01-23 (×2): qty 120, 30d supply, fill #0

## 2024-01-23 ENCOUNTER — Other Ambulatory Visit (HOSPITAL_COMMUNITY): Payer: Self-pay

## 2024-01-23 ENCOUNTER — Other Ambulatory Visit: Payer: Self-pay

## 2024-01-23 ENCOUNTER — Other Ambulatory Visit: Payer: Self-pay | Admitting: Family Medicine

## 2024-01-24 ENCOUNTER — Other Ambulatory Visit (HOSPITAL_COMMUNITY): Payer: Self-pay

## 2024-01-24 ENCOUNTER — Other Ambulatory Visit: Payer: Self-pay

## 2024-01-24 NOTE — Telephone Encounter (Signed)
 LOV: 07/29/23 [id number]:[id number] SCHEDULED LAST REFILL: amphetamine -dextroamphetamine  (ADDERALL) 15 MG tablet 12/13/23 60 TABLETS 0 REFILLS

## 2024-01-26 MED ORDER — AMPHETAMINE-DEXTROAMPHETAMINE 15 MG PO TABS
15.0000 mg | ORAL_TABLET | Freq: Two times a day (BID) | ORAL | 0 refills | Status: DC
  Filled 2024-02-12: qty 60, 30d supply, fill #0

## 2024-01-26 MED ORDER — AMPHETAMINE-DEXTROAMPHETAMINE 15 MG PO TABS
15.0000 mg | ORAL_TABLET | Freq: Two times a day (BID) | ORAL | 0 refills | Status: DC
  Filled 2024-01-26 – 2024-03-13 (×2): qty 60, 30d supply, fill #0

## 2024-01-26 MED ORDER — AMPHETAMINE-DEXTROAMPHETAMINE 15 MG PO TABS
15.0000 mg | ORAL_TABLET | Freq: Two times a day (BID) | ORAL | 0 refills | Status: DC
  Filled 2024-01-26 – 2024-01-27 (×2): qty 60, 30d supply, fill #0

## 2024-01-27 ENCOUNTER — Other Ambulatory Visit (HOSPITAL_COMMUNITY): Payer: Self-pay

## 2024-02-06 ENCOUNTER — Other Ambulatory Visit (HOSPITAL_COMMUNITY): Payer: Self-pay

## 2024-02-12 ENCOUNTER — Other Ambulatory Visit: Payer: Self-pay

## 2024-02-12 ENCOUNTER — Other Ambulatory Visit (HOSPITAL_COMMUNITY): Payer: Self-pay

## 2024-02-13 ENCOUNTER — Ambulatory Visit: Payer: Self-pay

## 2024-02-13 ENCOUNTER — Telehealth: Admitting: Physician Assistant

## 2024-02-13 ENCOUNTER — Other Ambulatory Visit (HOSPITAL_COMMUNITY): Payer: Self-pay

## 2024-02-13 DIAGNOSIS — B029 Zoster without complications: Secondary | ICD-10-CM

## 2024-02-13 MED ORDER — PREGABALIN 75 MG PO CAPS
75.0000 mg | ORAL_CAPSULE | Freq: Two times a day (BID) | ORAL | 1 refills | Status: DC
  Filled 2024-02-13: qty 180, 90d supply, fill #0

## 2024-02-13 MED ORDER — VALACYCLOVIR HCL 1 G PO TABS
1000.0000 mg | ORAL_TABLET | Freq: Three times a day (TID) | ORAL | 0 refills | Status: AC
  Filled 2024-02-13: qty 21, 7d supply, fill #0

## 2024-02-13 NOTE — Progress Notes (Signed)
 I have spent 5 minutes in review of e-visit questionnaire, review and updating patient chart, medical decision making and response to patient.   Piedad Climes, PA-C

## 2024-02-13 NOTE — Telephone Encounter (Signed)
 I just saw the E visit note.  I was going to rx valtrex  just in case, but it should have already been sent in.  Please check on patient tomorrow.  Thanks.

## 2024-02-13 NOTE — Telephone Encounter (Signed)
 Spoke to pt. I advised her I was concerned that if she had Shingles on her face, it could go in her eye which is not good. She agreed. Will let Dr Vallarie Gauze know. Pt stated the RN from [facility] said she was sending the message to us  to see if  Dr Vallarie Gauze would write her a rx. I advised she neeed to be evaluated. So I suggested since she cannot get seen in an office today, she could do a Virtual Urgent Care visit on [email or web address] today. She said she would do that.

## 2024-02-13 NOTE — Telephone Encounter (Signed)
 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

## 2024-02-13 NOTE — Progress Notes (Signed)
 Message sent to patient requesting further input regarding current symptoms. Awaiting patient response.

## 2024-02-13 NOTE — Telephone Encounter (Addendum)
 Copied from CRM [id number]. Topic: Clinical - Red Word Triage >> Feb 13, 2024  2:30 PM Chuck Crater wrote: Red Word that prompted transfer to Nurse Triage: Patient thinks she has shingles. There are blistery bumps where her forehead meets nose, patient has a headache, and fatigued. She states her head hurts where the bumps are.   Chief Complaint: suspects shingles Symptoms: rash of red bumps some blistering/crusting to forehead near nose, more spots today than yesterday, spots showing up elsewhere, 6/10 headache, 4-5/10 pain to rash, light itchiness, fatigue Frequency: continual Pertinent Negatives: Patient denies ear pain or ringing in ear "no more than normal," eye pain, blurred vision, facial weakness Disposition: [] 911 / [] ED /[] Urgent Care (no appt availability in office) / [] Appointment(In office/virtual)/ []  [facility]/ [] Home Care/ [x] Refused Recommended Disposition /[] [facility]/ []  Follow-up with PCP Additional Notes: Pt reporting that she has hx of shingles and 2 days ago rash started on forehead near nose that she confirms looks similar to previous shingles, currently red bumps with one that opened and crusted when itched on accident, more spots appeared since yesterday including further down on face, 4-5/10 pain to rash. Pt also reporting headache 6/10, fatigue, and light itchiness. Advised pt be examined in next 4 hours, no PCP office availability, advised pt be examined at [facility], pt requesting further recommendations and/or meds be sent in to [facility] by Dr. Vallarie Gauze, rather than going to UC. Advised pt call back if worsening. Pt verbalized understanding.  Reason for Disposition  [1] Shingles rash AND [2] spots start appearing other places on body  Answer Assessment - Initial Assessment Questions 1. APPEARANCE of RASH: "Describe the rash."      Looks like zit with no head but blistery, just red, can't tell if fluid in there or not, accidentally  scratched one and it opened not really like blood but not clear but not yellow liquid, kind of crusts and it dries, one of them is like that 2. LOCATION: "Where is the rash located?"      Forehead where it meets nose 3. ONSET: "When did the rash start?"      Maybe about 2 days ago, but gotten more 4. ITCHING: "Does the rash itch?" If Yes, ask: "How bad is the itch?"  (Scale 1-10; or mild, moderate, severe)     Really light itch not aggravating 5. PAIN: "Does the rash hurt?" If Yes, ask: "How bad is the pain?"  (Scale 0-10; or none, mild, moderate, severe)    - NONE (0): no pain    - MILD (1-3): doesn't interfere with normal activities     - MODERATE (4-7): interferes with normal activities or awakens from sleep     - SEVERE (8-10): excruciating pain, unable to do any normal activities     Not that bad unless really touching it 4-5/10, headache is 6/10 6. OTHER SYMPTOMS: "Do you have any other symptoms?" (e.g., fever)     Fatigue, headache  Not tiny not huge Have had shingles before over right eyebrow about a year ago, looks similar to that Can't take OTC meds One on cheek on face at smile line I do not break out with acne, 1 zit a year More spots than yesterday [facility]  Protocols used: Shingles (Zoster)-A-AH

## 2024-02-13 NOTE — Progress Notes (Signed)
 E-visit for Shingles   We are sorry that you are not feeling well. Here is how we plan to help!  Based on what you shared with me it looks like you have shingles.  Shingles or herpes zoster, is a common infection of the nerves.  It is a painful rash caused by the herpes zoster virus.  This is the same virus that causes chickenpox.  After a person has chickenpox, the virus remains inactive in the nerve cells.  Years later, the virus can become active again and travel to the skin.  It typically will appear on one side of the face or body.  Burning or shooting pain, tingling, or itching are early signs of the infection.  Blisters typically scab over in 7 to 10 days and clear up within 2-4 weeks. Shingles is only contagious to people that have never had the chickenpox, the chickenpox vaccine, or anyone who has a compromised immune system.  You should avoid contact with these type of people until your blisters scab over.  I have prescribed Valacyclovir  1g three times daily for 7 days . Giving facial involvement, I recommend you reach out to your eye doctor for assessment.  If you note any rash spreading to eye or vision change, you need an ER evaluation ASAP.   HOME CARE: Apply ice packs (wrapped in a thin towel), cool compresses, or soak in cool bath to help reduce pain. Use calamine lotion to calm itchy skin. Avoid scratching the rash. Avoid direct sunlight.  GET HELP RIGHT AWAY IF: Symptoms that don't away after treatment. A rash or blisters near your eye. Increased drainage, fever, or rash after treatment. Severe pain that doesn't go away.   MAKE SURE YOU   Understand these instructions. Will watch your condition. Will get help right away if you are not doing well or get worse.  Thank you for choosing an e-visit.  Your e-visit answers were reviewed by a board certified advanced clinical practitioner to complete your personal care plan. Depending upon the condition, your plan could  have included both over the counter or prescription medications.  Please review your pharmacy choice. Make sure the pharmacy is open so you can pick up prescription now. If there is a problem, you may contact your provider through [employer] and have the prescription routed to another pharmacy.  Your safety is important to us . If you have drug allergies check your prescription carefully.   For the next 24 hours you can use MyChart to ask questions about today's visit, request a non-urgent call back, or ask for a work or school excuse. You will get an email in the next two days asking about your experience. I hope that your e-visit has been valuable and will speed your recovery.

## 2024-02-17 NOTE — Telephone Encounter (Signed)
 Spoke with patient and she is doing fine. She states that the medication that she was given is working. She also appreciates the call.

## 2024-02-19 ENCOUNTER — Encounter: Payer: Self-pay | Admitting: Family Medicine

## 2024-02-25 ENCOUNTER — Other Ambulatory Visit: Payer: Self-pay

## 2024-02-25 ENCOUNTER — Other Ambulatory Visit (HOSPITAL_COMMUNITY): Payer: Self-pay

## 2024-02-25 DIAGNOSIS — M51369 Other intervertebral disc degeneration, lumbar region without mention of lumbar back pain or lower extremity pain: Secondary | ICD-10-CM | POA: Diagnosis not present

## 2024-02-25 DIAGNOSIS — Z79891 Long term (current) use of opiate analgesic: Secondary | ICD-10-CM | POA: Diagnosis not present

## 2024-02-25 MED ORDER — MORPHINE SULFATE 15 MG PO TABS
15.0000 mg | ORAL_TABLET | Freq: Four times a day (QID) | ORAL | 0 refills | Status: AC | PRN
  Filled 2024-02-25: qty 120, 30d supply, fill #0
  Filled 2024-02-26: qty 60, 15d supply, fill #0

## 2024-02-26 ENCOUNTER — Other Ambulatory Visit: Payer: Self-pay

## 2024-02-26 ENCOUNTER — Other Ambulatory Visit (HOSPITAL_COMMUNITY): Payer: Self-pay

## 2024-02-27 ENCOUNTER — Other Ambulatory Visit (HOSPITAL_COMMUNITY): Payer: Self-pay

## 2024-02-27 ENCOUNTER — Encounter: Payer: Self-pay | Admitting: Family Medicine

## 2024-02-27 ENCOUNTER — Ambulatory Visit: Admitting: Family Medicine

## 2024-02-27 VITALS — BP 118/74 | HR 99 | Temp 99.7°F | Ht 65.0 in | Wt 229.1 lb

## 2024-02-27 DIAGNOSIS — R739 Hyperglycemia, unspecified: Secondary | ICD-10-CM

## 2024-02-27 MED ORDER — ALBUTEROL SULFATE HFA 108 (90 BASE) MCG/ACT IN AERS
2.0000 | INHALATION_SPRAY | Freq: Four times a day (QID) | RESPIRATORY_TRACT | 2 refills | Status: AC | PRN
  Filled 2024-02-27 – 2024-02-28 (×2): qty 6.7, 25d supply, fill #0

## 2024-02-27 NOTE — Patient Instructions (Signed)
 Go to the lab on the way out.   If you have mychart we'll likely use that to update you.    Take care.  Glad to see you.

## 2024-02-27 NOTE — Progress Notes (Signed)
 She got tx for shingles.  She is back to taking lyrica  every other day.  Discussed.  She previously had troubles with seasonal allergies, ST.  She feels better now.    She needed refill on albuterol .  Rx sent.    "I've always fought my weight."  She had worked on diet for years.  She took phentermine in the past.  Currently on adderall.  Discussed not using phentermine with Adderall.  History of hyperglycemia.  See notes on labs.  Discussed rechecking her sugar.  See notes on labs.  Meds, vitals, and allergies reviewed.   ROS: Per HPI unless specifically indicated in ROS section   GEN: nad, alert and oriented HEENT: ncat NECK: supple w/o LA CV: rrr.  PULM: ctab, no inc wob ABD: soft, +bs EXT: no edema SKIN: Well-perfused

## 2024-02-28 ENCOUNTER — Other Ambulatory Visit: Payer: Self-pay

## 2024-02-28 ENCOUNTER — Other Ambulatory Visit (HOSPITAL_COMMUNITY): Payer: Self-pay

## 2024-02-28 LAB — HEMOGLOBIN A1C: Hgb A1c MFr Bld: 5.8 % (ref 4.6–6.5)

## 2024-03-01 ENCOUNTER — Ambulatory Visit: Payer: Self-pay | Admitting: Family Medicine

## 2024-03-01 DIAGNOSIS — R739 Hyperglycemia, unspecified: Secondary | ICD-10-CM | POA: Insufficient documentation

## 2024-03-01 NOTE — Assessment & Plan Note (Signed)
 Discussed hyperglycemia and weight management.  Continue work on diet and exercise.  Would not add on phentermine at this point.  Routine cautions for medications like Ozempic discussed with patient.  Reasonable to check labs first.  See notes on labs.

## 2024-03-04 ENCOUNTER — Other Ambulatory Visit (HOSPITAL_COMMUNITY): Payer: Self-pay

## 2024-03-04 MED ORDER — WEGOVY 0.25 MG/0.5ML ~~LOC~~ SOAJ
0.2500 mg | SUBCUTANEOUS | 2 refills | Status: DC
  Filled 2024-03-04 – 2024-03-24 (×2): qty 2, 28d supply, fill #0

## 2024-03-05 ENCOUNTER — Encounter (HOSPITAL_COMMUNITY): Payer: Self-pay

## 2024-03-05 ENCOUNTER — Other Ambulatory Visit (HOSPITAL_COMMUNITY): Payer: Self-pay

## 2024-03-10 ENCOUNTER — Other Ambulatory Visit (HOSPITAL_COMMUNITY): Payer: Self-pay

## 2024-03-10 ENCOUNTER — Telehealth: Admitting: Emergency Medicine

## 2024-03-10 DIAGNOSIS — B9689 Other specified bacterial agents as the cause of diseases classified elsewhere: Secondary | ICD-10-CM

## 2024-03-10 DIAGNOSIS — J019 Acute sinusitis, unspecified: Secondary | ICD-10-CM

## 2024-03-10 MED ORDER — AMOXICILLIN-POT CLAVULANATE 875-125 MG PO TABS
1.0000 | ORAL_TABLET | Freq: Two times a day (BID) | ORAL | 0 refills | Status: DC
  Filled 2024-03-10: qty 14, 7d supply, fill #0

## 2024-03-10 NOTE — Progress Notes (Signed)
 E-Visit for Sinus Problems  We are sorry that you are not feeling well.  Here is how we plan to help!  Based on what you have shared with me it looks like you have sinusitis.  Sinusitis is inflammation and infection in the sinus cavities of the head.  Based on your presentation I believe you most likely have Acute Bacterial Sinusitis.  This is an infection caused by bacteria and is treated with antibiotics. I have prescribed Augmentin 875mg /125mg  one tablet twice daily with food, for 7 days.   You may use an oral decongestant such as Mucinex D or if you have glaucoma or high blood pressure use plain Mucinex.   Saline nasal spray help and can safely be used as often as needed for congestion.  Try using saline irrigation, such as with a neti pot, several times a day while you are sick. Many neti pots come with salt packets premeasured to use to make saline. If you use your own salt, make sure it is kosher salt or sea salt (don't use table salt as it has iodine in it and you don't need that in your nose). Use distilled water to make saline. If you mix your own saline using your own salt, the recipe is 1/4 teaspoon salt in 1 cup warm water. Using saline irrigation can help prevent and treat sinus infections.   If you develop worsening sinus pain, fever or notice severe headache and vision changes, or if symptoms are not better after completion of antibiotic, please schedule an appointment with a health care provider.    Sinus infections are not as easily transmitted as other respiratory infection, however we still recommend that you avoid close contact with loved ones, especially the very young and elderly.  Remember to wash your hands thoroughly throughout the day as this is the number one way to prevent the spread of infection!  Home Care: Only take medications as instructed by your medical team. Complete the entire course of an antibiotic. Do not take these medications with alcohol. A steam or  ultrasonic humidifier can help congestion.  You can place a towel over your head and breathe in the steam from hot water coming from a faucet. Avoid close contacts especially the very young and the elderly. Cover your mouth when you cough or sneeze. Always remember to wash your hands.  Get Help Right Away If: You develop worsening fever or sinus pain. You develop a severe head ache or visual changes. Your symptoms persist after you have completed your treatment plan.  Make sure you Understand these instructions. Will watch your condition. Will get help right away if you are not doing well or get worse.  Thank you for choosing an e-visit.  Your e-visit answers were reviewed by a board certified advanced clinical practitioner to complete your personal care plan. Depending upon the condition, your plan could have included both over the counter or prescription medications.  Please review your pharmacy choice. Make sure the pharmacy is open so you can pick up prescription now. If there is a problem, you may contact your provider through [employer] and have the prescription routed to another pharmacy.  Your safety is important to [place]. If you have drug allergies check your prescription carefully.   For the next 24 hours you can use MyChart to ask questions about today's visit, request a non-urgent call back, or ask for a work or school excuse. You will get an email in the next two days asking  about your experience. I hope that your e-visit has been valuable and will speed your recovery.  I have spent 5 minutes in review of e-visit questionnaire, review and updating patient chart, medical decision making and response to patient.   Rica Mast, PhD, FNP-BC

## 2024-03-12 ENCOUNTER — Other Ambulatory Visit (HOSPITAL_COMMUNITY): Payer: Self-pay

## 2024-03-12 MED ORDER — MORPHINE SULFATE 15 MG PO TABS
15.0000 mg | ORAL_TABLET | Freq: Four times a day (QID) | ORAL | 0 refills | Status: AC | PRN
  Filled 2024-03-12 – 2024-05-01 (×3): qty 120, 30d supply, fill #0

## 2024-03-12 MED ORDER — OXYCODONE HCL 10 MG PO TABS
10.0000 mg | ORAL_TABLET | Freq: Four times a day (QID) | ORAL | 0 refills | Status: DC | PRN
  Filled 2024-03-12: qty 20, 5d supply, fill #0

## 2024-03-13 ENCOUNTER — Other Ambulatory Visit: Payer: Self-pay

## 2024-03-13 ENCOUNTER — Other Ambulatory Visit (HOSPITAL_COMMUNITY): Payer: Self-pay

## 2024-03-16 ENCOUNTER — Other Ambulatory Visit (HOSPITAL_COMMUNITY): Payer: Self-pay

## 2024-03-16 ENCOUNTER — Other Ambulatory Visit: Payer: Self-pay

## 2024-03-19 ENCOUNTER — Encounter (HOSPITAL_COMMUNITY): Payer: Self-pay

## 2024-03-20 ENCOUNTER — Other Ambulatory Visit (HOSPITAL_COMMUNITY): Payer: Self-pay

## 2024-03-24 ENCOUNTER — Other Ambulatory Visit (HOSPITAL_COMMUNITY): Payer: Self-pay

## 2024-03-26 ENCOUNTER — Other Ambulatory Visit (HOSPITAL_COMMUNITY): Payer: Self-pay

## 2024-03-26 ENCOUNTER — Telehealth: Admitting: Physician Assistant

## 2024-03-26 DIAGNOSIS — R3989 Other symptoms and signs involving the genitourinary system: Secondary | ICD-10-CM | POA: Diagnosis not present

## 2024-03-26 MED ORDER — NITROFURANTOIN MONOHYD MACRO 100 MG PO CAPS
100.0000 mg | ORAL_CAPSULE | Freq: Two times a day (BID) | ORAL | 0 refills | Status: DC
  Filled 2024-03-26: qty 10, 5d supply, fill #0

## 2024-03-26 MED ORDER — OXYCODONE HCL 10 MG PO TABS
10.0000 mg | ORAL_TABLET | Freq: Four times a day (QID) | ORAL | 0 refills | Status: DC | PRN
  Filled 2024-03-26: qty 20, 5d supply, fill #0

## 2024-03-26 NOTE — Progress Notes (Signed)

## 2024-03-31 ENCOUNTER — Other Ambulatory Visit: Payer: Self-pay

## 2024-03-31 ENCOUNTER — Other Ambulatory Visit (HOSPITAL_COMMUNITY): Payer: Self-pay

## 2024-03-31 ENCOUNTER — Encounter (HOSPITAL_COMMUNITY): Payer: Self-pay

## 2024-03-31 MED ORDER — MORPHINE SULFATE ER 15 MG PO TBCR
15.0000 mg | EXTENDED_RELEASE_TABLET | Freq: Two times a day (BID) | ORAL | 0 refills | Status: DC
  Filled 2024-03-31: qty 10, 5d supply, fill #0

## 2024-04-02 ENCOUNTER — Other Ambulatory Visit (HOSPITAL_COMMUNITY): Payer: Self-pay

## 2024-04-02 MED ORDER — MORPHINE SULFATE ER 15 MG PO TBCR
15.0000 mg | EXTENDED_RELEASE_TABLET | Freq: Two times a day (BID) | ORAL | 0 refills | Status: AC
  Filled 2024-04-02: qty 60, 30d supply, fill #0

## 2024-04-17 ENCOUNTER — Encounter

## 2024-05-01 ENCOUNTER — Other Ambulatory Visit: Payer: Self-pay

## 2024-05-01 ENCOUNTER — Other Ambulatory Visit (HOSPITAL_COMMUNITY): Payer: Self-pay

## 2024-05-20 ENCOUNTER — Other Ambulatory Visit: Payer: Self-pay | Admitting: Family Medicine

## 2024-05-20 ENCOUNTER — Other Ambulatory Visit: Payer: Self-pay

## 2024-05-20 MED ORDER — VENLAFAXINE HCL ER 75 MG PO CP24
75.0000 mg | ORAL_CAPSULE | Freq: Every day | ORAL | 1 refills | Status: DC
  Filled 2024-05-20: qty 90, 90d supply, fill #0

## 2024-05-20 MED ORDER — AMPHETAMINE-DEXTROAMPHETAMINE 15 MG PO TABS
15.0000 mg | ORAL_TABLET | Freq: Two times a day (BID) | ORAL | 0 refills | Status: DC
  Filled 2024-05-20 – 2024-08-04 (×2): qty 60, 30d supply, fill #0

## 2024-05-20 MED ORDER — AMPHETAMINE-DEXTROAMPHETAMINE 15 MG PO TABS
15.0000 mg | ORAL_TABLET | Freq: Two times a day (BID) | ORAL | 0 refills | Status: DC
  Filled 2024-05-20: qty 60, 30d supply, fill #0

## 2024-05-20 MED ORDER — AMPHETAMINE-DEXTROAMPHETAMINE 15 MG PO TABS
15.0000 mg | ORAL_TABLET | Freq: Two times a day (BID) | ORAL | 0 refills | Status: DC
  Filled 2024-05-20 – 2024-07-01 (×2): qty 60, 30d supply, fill #0

## 2024-05-20 NOTE — Telephone Encounter (Signed)
 Name of Medication:  Adderall Name of Pharmacy:  [facility] Last Fill or Written Date and Quantity:  03/16/24, #60 Last Office Visit and Type:  02/27/24, hyperglycemia Next Office Visit and Type:  none Last Controlled Substance Agreement Date:  none Last UDS:  07/29/23  Effexor  XR last filled:  11/25/23, #90

## 2024-05-21 ENCOUNTER — Other Ambulatory Visit (HOSPITAL_COMMUNITY): Payer: Self-pay

## 2024-05-21 ENCOUNTER — Other Ambulatory Visit: Payer: Self-pay

## 2024-05-22 ENCOUNTER — Other Ambulatory Visit: Payer: Self-pay | Admitting: Family Medicine

## 2024-05-22 NOTE — Telephone Encounter (Signed)
 LOV: 02/27/24 WNC:wnuypwh scheduled  Last Refill: ALPRAZolam  (XANAX ) 0.5 MG tablet 12/20/23 30 tablets 1 refill

## 2024-05-24 ENCOUNTER — Other Ambulatory Visit (HOSPITAL_COMMUNITY): Payer: Self-pay

## 2024-05-24 MED ORDER — ALPRAZOLAM 0.5 MG PO TABS
0.2500 mg | ORAL_TABLET | Freq: Two times a day (BID) | ORAL | 1 refills | Status: DC | PRN
  Filled 2024-05-24: qty 30, 15d supply, fill #0
  Filled 2024-07-01: qty 30, 15d supply, fill #1

## 2024-05-25 ENCOUNTER — Other Ambulatory Visit (HOSPITAL_COMMUNITY): Payer: Self-pay

## 2024-05-27 ENCOUNTER — Other Ambulatory Visit (HOSPITAL_COMMUNITY): Payer: Self-pay

## 2024-05-27 ENCOUNTER — Other Ambulatory Visit: Payer: Self-pay

## 2024-05-27 MED ORDER — MORPHINE SULFATE 15 MG PO TABS
15.0000 mg | ORAL_TABLET | Freq: Four times a day (QID) | ORAL | 0 refills | Status: DC | PRN
  Filled 2024-05-29: qty 120, 30d supply, fill #0

## 2024-05-29 ENCOUNTER — Other Ambulatory Visit (HOSPITAL_COMMUNITY): Payer: Self-pay

## 2024-06-11 ENCOUNTER — Telehealth: Admitting: Nurse Practitioner

## 2024-06-11 ENCOUNTER — Other Ambulatory Visit (HOSPITAL_COMMUNITY): Payer: Self-pay

## 2024-06-11 DIAGNOSIS — H6991 Unspecified Eustachian tube disorder, right ear: Secondary | ICD-10-CM

## 2024-06-11 MED ORDER — IPRATROPIUM BROMIDE 0.03 % NA SOLN
2.0000 | Freq: Two times a day (BID) | NASAL | 12 refills | Status: DC
  Filled 2024-06-11: qty 30, 43d supply, fill #0

## 2024-06-11 NOTE — Progress Notes (Signed)

## 2024-06-11 NOTE — Progress Notes (Signed)
 I have spent 5 minutes in review of e-visit questionnaire, review and updating patient chart, medical decision making and response to patient.   Claiborne Rigg, NP

## 2024-06-23 ENCOUNTER — Other Ambulatory Visit (HOSPITAL_COMMUNITY): Payer: Self-pay

## 2024-07-01 ENCOUNTER — Other Ambulatory Visit: Payer: Self-pay

## 2024-07-01 ENCOUNTER — Other Ambulatory Visit (HOSPITAL_COMMUNITY): Payer: Self-pay

## 2024-07-02 ENCOUNTER — Other Ambulatory Visit (HOSPITAL_COMMUNITY): Payer: Self-pay

## 2024-07-02 MED ORDER — MORPHINE SULFATE 15 MG PO TABS
15.0000 mg | ORAL_TABLET | Freq: Four times a day (QID) | ORAL | 0 refills | Status: DC | PRN
  Filled 2024-07-02: qty 120, 30d supply, fill #0

## 2024-08-04 ENCOUNTER — Other Ambulatory Visit: Payer: Self-pay

## 2024-08-04 ENCOUNTER — Other Ambulatory Visit (HOSPITAL_COMMUNITY): Payer: Self-pay

## 2024-08-05 ENCOUNTER — Other Ambulatory Visit (HOSPITAL_COMMUNITY): Payer: Self-pay

## 2024-08-05 MED ORDER — MORPHINE SULFATE 15 MG PO TABS
15.0000 mg | ORAL_TABLET | Freq: Four times a day (QID) | ORAL | 0 refills | Status: AC | PRN
  Filled 2024-08-05: qty 120, 30d supply, fill #0

## 2024-09-01 ENCOUNTER — Other Ambulatory Visit (HOSPITAL_COMMUNITY): Payer: Self-pay

## 2024-09-01 DIAGNOSIS — Z79891 Long term (current) use of opiate analgesic: Secondary | ICD-10-CM | POA: Diagnosis not present

## 2024-09-01 DIAGNOSIS — F909 Attention-deficit hyperactivity disorder, unspecified type: Secondary | ICD-10-CM | POA: Diagnosis not present

## 2024-09-01 DIAGNOSIS — M51369 Other intervertebral disc degeneration, lumbar region without mention of lumbar back pain or lower extremity pain: Secondary | ICD-10-CM | POA: Diagnosis not present

## 2024-09-01 MED ORDER — MORPHINE SULFATE 15 MG PO TABS
15.0000 mg | ORAL_TABLET | Freq: Four times a day (QID) | ORAL | 0 refills | Status: AC
  Filled 2024-09-01 – 2024-09-03 (×2): qty 120, 30d supply, fill #0

## 2024-09-03 ENCOUNTER — Other Ambulatory Visit: Payer: Self-pay

## 2024-09-03 ENCOUNTER — Other Ambulatory Visit: Payer: Self-pay | Admitting: Family Medicine

## 2024-09-03 ENCOUNTER — Other Ambulatory Visit (HOSPITAL_COMMUNITY): Payer: Self-pay

## 2024-09-03 DIAGNOSIS — F988 Other specified behavioral and emotional disorders with onset usually occurring in childhood and adolescence: Secondary | ICD-10-CM

## 2024-09-03 DIAGNOSIS — F4320 Adjustment disorder, unspecified: Secondary | ICD-10-CM

## 2024-09-03 MED ORDER — AMPHETAMINE-DEXTROAMPHETAMINE 15 MG PO TABS
15.0000 mg | ORAL_TABLET | Freq: Two times a day (BID) | ORAL | 0 refills | Status: AC
  Filled 2024-09-03: qty 60, 30d supply, fill #0

## 2024-09-03 MED ORDER — AMPHETAMINE-DEXTROAMPHETAMINE 15 MG PO TABS
15.0000 mg | ORAL_TABLET | Freq: Two times a day (BID) | ORAL | 0 refills | Status: AC
  Filled 2024-09-03 – 2024-10-05 (×2): qty 60, 30d supply, fill #0

## 2024-09-03 MED ORDER — ALPRAZOLAM 0.5 MG PO TABS
0.2500 mg | ORAL_TABLET | Freq: Two times a day (BID) | ORAL | 1 refills | Status: DC | PRN
  Filled 2024-09-03: qty 30, 15d supply, fill #0

## 2024-09-03 NOTE — Telephone Encounter (Signed)
 Name of Medication:  Adderall, Alprazolam  Name of Pharmacy:  [facility]  Last Fill or Written Date and Quantity:       Adderall- 08/05/24, #60      Alprazolam - 07/03/24, #30 Last Office Visit and Type:  02/27/24, wt mgmt Next Office Visit and Type:  none Last Controlled Substance Agreement Date:  none Last UDS:  07/29/23

## 2024-09-03 NOTE — Telephone Encounter (Signed)
 Sent. Thanks.

## 2024-10-05 ENCOUNTER — Other Ambulatory Visit (HOSPITAL_COMMUNITY): Payer: Self-pay

## 2024-10-05 MED ORDER — MORPHINE SULFATE 15 MG PO TABS
15.0000 mg | ORAL_TABLET | Freq: Four times a day (QID) | ORAL | 0 refills | Status: AC | PRN
  Filled 2024-10-05: qty 120, 30d supply, fill #0

## 2024-10-13 ENCOUNTER — Other Ambulatory Visit (HOSPITAL_COMMUNITY): Payer: Self-pay

## 2024-10-13 ENCOUNTER — Encounter: Payer: Self-pay | Admitting: Family Medicine

## 2024-10-13 ENCOUNTER — Ambulatory Visit: Admitting: Family Medicine

## 2024-10-13 ENCOUNTER — Encounter (HOSPITAL_COMMUNITY): Payer: Self-pay

## 2024-10-13 VITALS — BP 122/80 | HR 99 | Temp 98.9°F | Ht 64.5 in | Wt 231.2 lb

## 2024-10-13 DIAGNOSIS — F988 Other specified behavioral and emotional disorders with onset usually occurring in childhood and adolescence: Secondary | ICD-10-CM | POA: Diagnosis not present

## 2024-10-13 DIAGNOSIS — G8929 Other chronic pain: Secondary | ICD-10-CM

## 2024-10-13 DIAGNOSIS — R480 Dyslexia and alexia: Secondary | ICD-10-CM | POA: Diagnosis not present

## 2024-10-13 DIAGNOSIS — E559 Vitamin D deficiency, unspecified: Secondary | ICD-10-CM

## 2024-10-13 DIAGNOSIS — Z0000 Encounter for general adult medical examination without abnormal findings: Secondary | ICD-10-CM

## 2024-10-13 DIAGNOSIS — M549 Dorsalgia, unspecified: Secondary | ICD-10-CM

## 2024-10-13 DIAGNOSIS — F4320 Adjustment disorder, unspecified: Secondary | ICD-10-CM

## 2024-10-13 DIAGNOSIS — E538 Deficiency of other specified B group vitamins: Secondary | ICD-10-CM

## 2024-10-13 DIAGNOSIS — Z7189 Other specified counseling: Secondary | ICD-10-CM

## 2024-10-13 MED ORDER — ALPRAZOLAM 0.5 MG PO TABS
0.2500 mg | ORAL_TABLET | Freq: Two times a day (BID) | ORAL | 2 refills | Status: AC | PRN
  Filled 2024-10-13 – 2024-11-04 (×2): qty 30, 15d supply, fill #0

## 2024-10-13 MED ORDER — VENLAFAXINE HCL ER 75 MG PO CP24
75.0000 mg | ORAL_CAPSULE | Freq: Every day | ORAL | 3 refills | Status: AC
  Filled 2024-10-13: qty 90, 90d supply, fill #0

## 2024-10-13 MED ORDER — VITAMIN D3 50 MCG (2000 UT) PO CAPS: ORAL_CAPSULE | ORAL | Status: AC

## 2024-10-13 MED ORDER — VENLAFAXINE HCL ER 150 MG PO CP24
150.0000 mg | ORAL_CAPSULE | Freq: Every day | ORAL | 3 refills | Status: AC
  Filled 2024-10-13: qty 90, 90d supply, fill #0

## 2024-10-13 NOTE — Patient Instructions (Signed)
Don't change your meds for now. Go to the lab on the way out.   If you have mychart we'll likely use that to update you.    Take care.  Glad to see you. 

## 2024-10-13 NOTE — Progress Notes (Unsigned)
 CPE- See plan.  Routine anticipatory guidance given to patient.  See health maintenance.  The possibility exists that previously documented standard health maintenance information may have been brought forward from a previous encounter into this note.  If needed, that same information has been updated to reflect the current situation based on today's encounter.    Pap and DXA not due. Mammogram d/w pt.  Encouraged.   D/w patient [id number] for colon cancer screening, including IFOB vs. colonoscopy.  Risks and benefits of both were discussed and patient voiced understanding.  Pt elects to consider 2026.   tdap 2015- defer 2026.   covid vaccine 2021 Flu shot done at work yearly PNA and shingles not due Living will d/w pt- she would have her mother designated if patient were incapacitated. Diet and exercise d/w pt. HIV screen neg ~2002 per patient.   HCV prev done with prenatal labs.     She needs a letter for testing for work, d/w pt.  H/o ADD and dyslexia.    On morphine  per outside clinic.  Taking lyrica  every other day as needed- higher dose caused swelling.     ADD and mood d/w pt.  Attention is still variable, but manageable.  Still on adderall at baseline.  No ADE on med.     B12 on replacement.  Compliant.  B12 level pending.    PMH and SH reviewed  Meds, vitals, and allergies reviewed.   ROS: Per HPI.  Unless specifically indicated otherwise in HPI, the patient denies:  General: fever. Eyes: acute vision changes ENT: sore throat Cardiovascular: chest pain Respiratory: SOB GI: vomiting GU: dysuria Musculoskeletal: acute back pain Derm: acute rash Neuro: acute motor dysfunction Psych: worsening mood Endocrine: polydipsia Heme: bleeding Allergy: hayfever  GEN: nad, alert and oriented HEENT: mucous membranes moist NECK: supple w/o LA CV: rrr. PULM: ctab, no inc wob ABD: soft, +bs EXT: no edema SKIN: no acute rash

## 2024-10-14 ENCOUNTER — Encounter: Payer: Self-pay | Admitting: Family Medicine

## 2024-10-14 LAB — COMPREHENSIVE METABOLIC PANEL WITH GFR
ALT: 16 U/L (ref 3–35)
AST: 19 U/L (ref 5–37)
Albumin: 4.3 g/dL (ref 3.5–5.2)
Alkaline Phosphatase: 42 U/L (ref 39–117)
BUN: 9 mg/dL (ref 6–23)
CO2: 26 meq/L (ref 19–32)
Calcium: 9.1 mg/dL (ref 8.4–10.5)
Chloride: 103 meq/L (ref 96–112)
Creatinine, Ser: 0.97 mg/dL (ref 0.40–1.20)
GFR: 70.72 mL/min
Glucose, Bld: 84 mg/dL (ref 70–99)
Potassium: 4.1 meq/L (ref 3.5–5.1)
Sodium: 138 meq/L (ref 135–145)
Total Bilirubin: 0.4 mg/dL (ref 0.2–1.2)
Total Protein: 7.3 g/dL (ref 6.0–8.3)

## 2024-10-14 LAB — CBC WITH DIFFERENTIAL/PLATELET
Basophils Absolute: 0.1 K/uL (ref 0.0–0.1)
Basophils Relative: 1.3 % (ref 0.0–3.0)
Eosinophils Absolute: 0.8 K/uL — ABNORMAL HIGH (ref 0.0–0.7)
Eosinophils Relative: 8.1 % — ABNORMAL HIGH (ref 0.0–5.0)
HCT: 40.9 % (ref 36.0–46.0)
Hemoglobin: 13.9 g/dL (ref 12.0–15.0)
Lymphocytes Relative: 38.8 % (ref 12.0–46.0)
Lymphs Abs: 3.8 K/uL (ref 0.7–4.0)
MCHC: 34.1 g/dL (ref 30.0–36.0)
MCV: 85.7 fl (ref 78.0–100.0)
Monocytes Absolute: 0.9 K/uL (ref 0.1–1.0)
Monocytes Relative: 8.7 % (ref 3.0–12.0)
Neutro Abs: 4.2 K/uL (ref 1.4–7.7)
Neutrophils Relative %: 43.1 % (ref 43.0–77.0)
Platelets: 305 K/uL (ref 150.0–400.0)
RBC: 4.77 Mil/uL (ref 3.87–5.11)
RDW: 13.1 % (ref 11.5–15.5)
WBC: 9.8 K/uL (ref 4.0–10.5)

## 2024-10-14 LAB — VITAMIN B12: Vitamin B-12: 373 pg/mL (ref 211–911)

## 2024-10-14 LAB — VITAMIN D 25 HYDROXY (VIT D DEFICIENCY, FRACTURES): VITD: 9.29 ng/mL — ABNORMAL LOW (ref 30.00–100.00)

## 2024-10-14 NOTE — Assessment & Plan Note (Signed)
 B12 on replacement.  Compliant.  B12 level pending.  See notes on labs.

## 2024-10-14 NOTE — Assessment & Plan Note (Signed)
 On morphine  per outside clinic.  Taking lyrica  every other day as needed- higher dose caused swelling.

## 2024-10-14 NOTE — Assessment & Plan Note (Signed)
 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  H/o ADD and dyslexia.  See following phone note.  She has already passed the exam, demonstrating content understanding but her license expired.  She previously took the exam as a written exam.  She was able to write, circle, and highlight information as she went along.  She needs a time extension if she is going to have to take the testing on a computer.  She had previous specific testing for dyslexia and ADD in the distant past.   I will work on a letter for the patient.

## 2024-10-14 NOTE — Assessment & Plan Note (Signed)
 Attention is still variable, but manageable.  Still on adderall at baseline.  No ADE on med.

## 2024-10-14 NOTE — Assessment & Plan Note (Signed)
Living will d/w pt- she would have her mother designated if patient were incapacitated. 

## 2024-10-14 NOTE — Assessment & Plan Note (Signed)
 Pap and DXA not due. Mammogram d/w pt.  Encouraged.   D/w patient [id number] for colon cancer screening, including IFOB vs. colonoscopy.  Risks and benefits of both were discussed and patient voiced understanding.  Pt elects to consider 2026.   tdap 2015- defer 2026 given allergy hx.   covid vaccine 2021 Flu shot done at work yearly PNA and shingles not due Living will d/w pt- she would have her mother designated if patient were incapacitated. Diet and exercise d/w pt. HIV screen neg ~2002 per patient.   HCV prev done with prenatal labs.

## 2024-10-16 LAB — DRUG MONITORING, PANEL 8 WITH CONFIRMATION, URINE
6 Acetylmorphine: NEGATIVE ng/mL
Alcohol Metabolites: NEGATIVE ng/mL
Alphahydroxyalprazolam: 33 ng/mL — ABNORMAL HIGH
Alphahydroxymidazolam: NEGATIVE ng/mL
Alphahydroxytriazolam: NEGATIVE ng/mL
Aminoclonazepam: NEGATIVE ng/mL
Amphetamine: 8550 ng/mL — ABNORMAL HIGH
Amphetamines: POSITIVE ng/mL — AB
Benzodiazepines: POSITIVE ng/mL — AB
Buprenorphine, Urine: NEGATIVE ng/mL
Cocaine Metabolite: NEGATIVE ng/mL
Codeine: NEGATIVE ng/mL
Creatinine: 217.5 mg/dL
Hydrocodone: NEGATIVE ng/mL
Hydromorphone: 107 ng/mL — ABNORMAL HIGH
Hydroxyethylflurazepam: NEGATIVE ng/mL
Lorazepam: NEGATIVE ng/mL
MDMA: NEGATIVE ng/mL
Marijuana Metabolite: NEGATIVE ng/mL
Methamphetamine: NEGATIVE ng/mL
Morphine: >10000 ng/mL — ABNORMAL HIGH
Nordiazepam: NEGATIVE ng/mL
Norhydrocodone: NEGATIVE ng/mL
Opiates: POSITIVE ng/mL — AB
Oxazepam: NEGATIVE ng/mL
Oxidant: NEGATIVE ug/mL
Oxycodone: NEGATIVE ng/mL
Temazepam: NEGATIVE ng/mL
pH: 5 (ref 4.5–9.0)

## 2024-10-16 LAB — DM TEMPLATE

## 2024-10-18 ENCOUNTER — Ambulatory Visit: Payer: Self-pay | Admitting: Family Medicine

## 2024-10-18 DIAGNOSIS — E559 Vitamin D deficiency, unspecified: Secondary | ICD-10-CM

## 2024-10-18 MED ORDER — VITAMIN D (ERGOCALCIFEROL) 1.25 MG (50000 UNIT) PO CAPS
50000.0000 [IU] | ORAL_CAPSULE | ORAL | 0 refills | Status: AC
  Filled 2024-10-18: qty 12, 84d supply, fill #0

## 2024-10-19 ENCOUNTER — Other Ambulatory Visit (HOSPITAL_COMMUNITY): Payer: Self-pay

## 2024-10-19 ENCOUNTER — Other Ambulatory Visit (HOSPITAL_BASED_OUTPATIENT_CLINIC_OR_DEPARTMENT_OTHER): Payer: Self-pay

## 2024-10-23 ENCOUNTER — Other Ambulatory Visit (HOSPITAL_COMMUNITY): Payer: Self-pay

## 2024-10-31 ENCOUNTER — Other Ambulatory Visit: Payer: Self-pay

## 2024-10-31 ENCOUNTER — Telehealth: Admitting: Family Medicine

## 2024-10-31 DIAGNOSIS — K047 Periapical abscess without sinus: Secondary | ICD-10-CM

## 2024-10-31 MED ORDER — AMOXICILLIN-POT CLAVULANATE 875-125 MG PO TABS
1.0000 | ORAL_TABLET | Freq: Two times a day (BID) | ORAL | 0 refills | Status: AC
  Filled 2024-10-31: qty 20, 10d supply, fill #0

## 2024-10-31 NOTE — Progress Notes (Signed)
 E-Visit for Dental Pain  We are sorry that you are not feeling well.  Here is how we plan to help!  Based on what you have shared with me in the questionnaire, it sounds like you have dental infection  Augmentin  875-125mg  twice a day for 7 days  It is imperative that you see a dentist within 10 days of this eVisit to determine the cause of the dental pain and be sure it is adequately treated  A toothache or tooth pain is caused when the nerve in the root of a tooth or surrounding a tooth is irritated. Dental (tooth) infection, decay, injury, or loss of a tooth are the most common causes of dental pain. Pain may also occur after an extraction (tooth is pulled out). Pain sometimes originates from other areas and radiates to the jaw, thus appearing to be tooth pain.Bacteria growing inside your mouth can contribute to gum disease and dental decay, both of which can cause pain. A toothache occurs from inflammation of the central portion of the tooth called pulp. The pulp contains nerve endings that are very sensitive to pain. Inflammation to the pulp or pulpitis may be caused by dental cavities, trauma, and infection.    HOME CARE:   For toothaches: Over-the-counter pain medications such as acetaminophen  or ibuprofen  may be used. Take these as directed on the package while you arrange for a dental appointment. Avoid very cold or hot foods, because they may make the pain worse. You may get relief from biting on a cotton ball soaked in oil of cloves. You can get oil of cloves at most drug stores.  For jaw pain:  Aspirin  may be helpful for problems in the joint of the jaw in adults. If pain happens every time you open your mouth widely, the temporomandibular joint (TMJ) may be the source of the pain. Yawning or taking a large bite of food may worsen the pain. An appointment with your doctor or dentist will help you find the cause.     GET HELP RIGHT AWAY IF:  You have a high fever or chills If  you have had a recent head or face injury and develop headache, light headedness, nausea, vomiting, or other symptoms that concern you after an injury to your face or mouth, you could have a more serious injury in addition to your dental injury. A facial rash associated with a toothache: This condition may improve with medication. Contact your doctor for them to decide what is appropriate. Any jaw pain occurring with chest pain: Although jaw pain is most commonly caused by dental disease, it is sometimes referred pain from other areas. People with heart disease, especially people who have had stents placed, people with diabetes, or those who have had heart surgery may have jaw pain as a symptom of heart attack or angina. If your jaw or tooth pain is associated with lightheadedness, sweating, or shortness of breath, you should see a doctor as soon as possible. Trouble swallowing or excessive pain or bleeding from gums: If you have a history of a weakened immune system, diabetes, or steroid use, you may be more susceptible to infections. Infections can often be more severe and extensive or caused by unusual organisms. Dental and gum infections in people with these conditions may require more aggressive treatment. An abscess may need draining or IV antibiotics, for example.  MAKE SURE YOU   Understand these instructions. Will watch your condition. Will get help right away if you are not  doing well or get worse.  Thank you for choosing an e-visit.  Your e-visit answers were reviewed by a board certified advanced clinical practitioner to complete your personal care plan. Depending upon the condition, your plan could have included both over the counter or prescription medications.  Please review your pharmacy choice. Make sure the pharmacy is open so you can pick up prescription now. If there is a problem, you may contact your provider through [employer] and have the prescription routed to another  pharmacy.  Your safety is important to us . If you have drug allergies check your prescription carefully.   For the next 24 hours you can use MyChart to ask questions about today's visit, request a non-urgent call back, or ask for a work or school excuse. You will get an email in the next two days asking about your experience. I hope that your e-visit has been valuable and will speed your recovery.  I have spent 5 minutes in review of e-visit questionnaire, review and updating patient chart, medical decision making and response to patient.   Valbona Slabach, FNP

## 2024-11-04 ENCOUNTER — Other Ambulatory Visit: Payer: Self-pay

## 2024-11-04 ENCOUNTER — Other Ambulatory Visit: Payer: Self-pay | Admitting: Family Medicine

## 2024-11-04 ENCOUNTER — Other Ambulatory Visit (HOSPITAL_COMMUNITY): Payer: Self-pay

## 2024-11-04 DIAGNOSIS — M542 Cervicalgia: Secondary | ICD-10-CM

## 2024-11-04 MED ORDER — ETODOLAC 300 MG PO CAPS
300.0000 mg | ORAL_CAPSULE | Freq: Two times a day (BID) | ORAL | 3 refills | Status: AC | PRN
  Filled 2024-11-04: qty 60, 30d supply, fill #0

## 2024-11-04 MED ORDER — MORPHINE SULFATE 15 MG PO TABS
15.0000 mg | ORAL_TABLET | Freq: Four times a day (QID) | ORAL | 0 refills | Status: AC
  Filled 2024-11-04: qty 120, 30d supply, fill #0

## 2024-11-05 ENCOUNTER — Other Ambulatory Visit (HOSPITAL_COMMUNITY): Payer: Self-pay

## 2024-11-05 ENCOUNTER — Other Ambulatory Visit: Payer: Self-pay

## 2024-11-06 ENCOUNTER — Other Ambulatory Visit (HOSPITAL_COMMUNITY): Payer: Self-pay
